# Patient Record
Sex: Male | Born: 1990 | Race: White | Hispanic: No | Marital: Single | Smoking: Never smoker
Health system: Southern US, Community
[De-identification: ages and names within clinical notes are randomized; demographics above are authoritative.]

## PROBLEM LIST (undated history)

## (undated) DIAGNOSIS — F32A Depression, unspecified: Secondary | ICD-10-CM

## (undated) DIAGNOSIS — F419 Anxiety disorder, unspecified: Secondary | ICD-10-CM

## (undated) DIAGNOSIS — F1910 Other psychoactive substance abuse, uncomplicated: Secondary | ICD-10-CM

## (undated) DIAGNOSIS — F319 Bipolar disorder, unspecified: Secondary | ICD-10-CM

## (undated) DIAGNOSIS — F840 Autistic disorder: Secondary | ICD-10-CM

## (undated) HISTORY — PX: NO PAST SURGERIES: SHX2092

## (undated) HISTORY — DX: Depression, unspecified: F32.A

## (undated) HISTORY — DX: Bipolar disorder, unspecified: F31.9

## (undated) HISTORY — DX: Autistic disorder: F84.0

## (undated) HISTORY — DX: Other psychoactive substance abuse, uncomplicated: F19.10

## (undated) HISTORY — DX: Anxiety disorder, unspecified: F41.9

---

## 2019-07-31 LAB — BASIC METABOLIC PANEL
BUN: 7 (ref 4–21)
CO2: 20 (ref 13–22)
Chloride: 108 (ref 99–108)
Creatinine: 0.8 (ref 0.6–1.3)
Glucose: 86
Potassium: 3.5 (ref 3.4–5.3)
Sodium: 140 (ref 137–147)

## 2019-07-31 LAB — CBC: RBC: 5.2 — AB (ref 3.87–5.11)

## 2019-07-31 LAB — CBC AND DIFFERENTIAL
HCT: 45 (ref 41–53)
Hemoglobin: 14.7 (ref 13.5–17.5)
Platelets: 290 (ref 150–399)
WBC: 9.2

## 2019-07-31 LAB — COMPREHENSIVE METABOLIC PANEL
Calcium: 9 (ref 8.7–10.7)
GFR calc non Af Amer: 122

## 2019-07-31 LAB — TESTOSTERONE: Testosterone: 280

## 2019-07-31 LAB — PSA: PSA: 0.4

## 2019-08-03 DIAGNOSIS — F840 Autistic disorder: Secondary | ICD-10-CM | POA: Insufficient documentation

## 2019-08-03 DIAGNOSIS — F411 Generalized anxiety disorder: Secondary | ICD-10-CM | POA: Insufficient documentation

## 2019-08-06 DIAGNOSIS — F1990 Other psychoactive substance use, unspecified, uncomplicated: Secondary | ICD-10-CM | POA: Insufficient documentation

## 2019-08-06 DIAGNOSIS — F1021 Alcohol dependence, in remission: Secondary | ICD-10-CM | POA: Insufficient documentation

## 2019-08-06 DIAGNOSIS — F429 Obsessive-compulsive disorder, unspecified: Secondary | ICD-10-CM | POA: Insufficient documentation

## 2019-08-06 DIAGNOSIS — F439 Reaction to severe stress, unspecified: Secondary | ICD-10-CM | POA: Insufficient documentation

## 2019-08-06 DIAGNOSIS — F909 Attention-deficit hyperactivity disorder, unspecified type: Secondary | ICD-10-CM | POA: Insufficient documentation

## 2019-08-30 LAB — HEPATIC FUNCTION PANEL
ALT: 133 — AB (ref 10–40)
AST: 72 — AB (ref 14–40)
Alkaline Phosphatase: 107 (ref 25–125)
Bilirubin, Total: 1

## 2020-01-31 ENCOUNTER — Ambulatory Visit
Admission: EM | Admit: 2020-01-31 | Discharge: 2020-01-31 | Disposition: A | Discharge status: Home / Self Care | Payer: Medicaid Other

## 2020-01-31 ENCOUNTER — Other Ambulatory Visit: Payer: Self-pay

## 2020-01-31 DIAGNOSIS — R002 Palpitations: Secondary | ICD-10-CM | POA: Diagnosis not present

## 2020-01-31 DIAGNOSIS — B349 Viral infection, unspecified: Secondary | ICD-10-CM | POA: Insufficient documentation

## 2020-01-31 NOTE — ED Provider Notes (Signed)
Jorge Hanna    CSN: [id number] Arrival date & time: 01/31/20  7741      History   Chief Complaint Chief Complaint  Patient presents with  . Fatigue  . Anorexia    HPI Jorge Hanna is a 29 y.o. male.   Patient presents with 2-week history of fatigue, weakness, chills.  He also reports 1 episode of feeling heart palpitations and tachycardia 2 days ago.  Patient states all of his symptoms come and go and he currently is asymptomatic.  He denies fever, rash, sore throat, cough, shortness of breath, vomiting, diarrhea, or other symptoms.  He denies pertinent medical history.  The history is provided by the patient and medical records.    History reviewed. No pertinent past medical history.  There are no problems to display for this patient.   History reviewed. No pertinent surgical history.     Home Medications    Prior to Admission medications   Medication Sig Start Date End Date Taking? Authorizing Provider  [facility] MELATONIN 3 MG TABS tablet  11/27/19   [provider]  DULoxetine (CYMBALTA) 60 MG capsule Take 60 mg by mouth daily. 01/21/20   [provider]  hydrOXYzine (VISTARIL) 50 MG capsule Take 50 mg by mouth 2 (two) times daily as needed. 01/25/20   [provider]  lamoTRIgine (LAMICTAL) 100 MG tablet Take 100 mg by mouth daily. 09/30/19   [provider]  lithium 300 MG tablet Take 450 mg by mouth in the morning and at bedtime. 01/21/20   [provider]  Oxcarbazepine (TRILEPTAL) 300 MG tablet Take 300 mg by mouth 2 (two) times daily. 01/09/20   [provider]  traZODone (DESYREL) 50 MG tablet  01/21/20   [provider]    Family History History reviewed. No pertinent family history.  Social History Social History   Tobacco Use  . Smoking status: Never Smoker  . Smokeless tobacco: Never Used  Vaping Use  . Vaping Use: Never used  Substance Use Topics  . Alcohol use: Not Currently  .  Drug use: Not Currently     Allergies   Patient has no known allergies.   Review of Systems Review of Systems  Constitutional: Positive for fatigue. Negative for chills and fever.  HENT: Negative for ear pain and sore throat.   Eyes: Negative for pain and visual disturbance.  Respiratory: Negative for cough and shortness of breath.   Cardiovascular: Positive for palpitations. Negative for chest pain.  Gastrointestinal: Negative for abdominal pain, diarrhea and vomiting.  Genitourinary: Negative for dysuria and hematuria.  Musculoskeletal: Negative for arthralgias and back pain.  Skin: Negative for color change and rash.  Neurological: Positive for weakness. Negative for seizures and syncope.       Generalized weakness; no focal weakness.   All other systems reviewed and are negative.    Physical Exam Triage Vital Signs ED Triage Vitals  Enc Vitals Group     BP      Pulse      Resp      Temp      Temp src      SpO2      Weight      Height      Head Circumference      Peak Flow      Pain Score      Pain Loc      Pain Edu?      Excl. in GC?    No  data found.  Updated Vital Signs BP (!) 141/84 (BP Location: Right Arm)   Pulse (!) 111   Temp 98.9 F (37.2 C) (Oral)   SpO2 98%   Visual Acuity Right Eye Distance:   Left Eye Distance:   Bilateral Distance:    Right Eye Near:   Left Eye Near:    Bilateral Near:     Physical Exam Vitals and nursing note reviewed.  Constitutional:      General: He is not in acute distress.    Appearance: He is well-developed. He is not ill-appearing.  HENT:     Head: Normocephalic and atraumatic.     Mouth/Throat:     Mouth: Mucous membranes are moist.  Eyes:     Conjunctiva/sclera: Conjunctivae normal.  Cardiovascular:     Rate and Rhythm: Regular rhythm. Tachycardia present.     Heart sounds: Normal heart sounds.  Pulmonary:     Effort: Pulmonary effort is normal. No respiratory distress.     Breath sounds: Normal  breath sounds.  Abdominal:     Palpations: Abdomen is soft.     Tenderness: There is no abdominal tenderness.  Musculoskeletal:     Cervical back: Neck supple.  Skin:    General: Skin is warm and dry.     Findings: No rash.  Neurological:     General: No focal deficit present.     Mental Status: He is alert and oriented to person, place, and time.     Sensory: No sensory deficit.     Motor: No weakness.     Gait: Gait normal.      UC Treatments / Results  Labs (all labs ordered are listed, but only abnormal results are displayed) Labs Reviewed  NOVEL CORONAVIRUS, NAA    EKG   Radiology No results found.  Procedures Procedures (including critical care time)  Medications Ordered in UC Medications - No data to display  Initial Impression / Assessment and Plan / UC Course  I have reviewed the triage vital signs and the nursing notes.  Pertinent labs & imaging results that were available during my care of the patient were reviewed by me and considered in my medical decision making (see chart for details).   Viral illness, palpitations.  EKG shows sinus rhythm, rate 88, no ST elevation, no previous to compare.  Instructed patient to go to the ED if he has acute chest pain, palpitations, or other concerning symptoms.  PCR COVID pending.  Instructed patient to self quarantine until the test result is back.  Discussed symptomatic treatment including Tylenol, rest, hydration.  Instructed patient to follow up with PCP if his symptoms are not improving  Patient agrees to plan of care.    Final Clinical Impressions(s) / UC Diagnoses   Final diagnoses:  Viral illness  Palpitations     Discharge Instructions     Your COVID test is pending.  You should self quarantine until the test result is back.    Take Tylenol as needed for fever or discomfort.  Rest and keep yourself hydrated.    Follow up with your primary care provider if your symptoms are not improving.          ED Prescriptions    None     PDMP not reviewed this encounter.   Mickie Bail, NP 01/31/20 1042

## 2020-01-31 NOTE — Discharge Instructions (Signed)
Your COVID test is pending.  You should self quarantine until the test result is back.    Take Tylenol as needed for fever or discomfort.  Rest and keep yourself hydrated.    Follow-up with your primary care provider if your symptoms are not improving.     

## 2020-01-31 NOTE — ED Notes (Signed)
Patient presents to Urgent Care with complaints of chills, weakness, loss of appetite on/off for 2 weeks, with one exp. an episode of heart palpitations. Mom shares instructed by his psy to get evaluated.

## 2020-01-31 NOTE — ED Triage Notes (Signed)
Pt reports being here for fatigue, chills, weakness on/off for two weeks. Reports having one episode of heart palpitation. Pt reports psy provider instructed pt to come here for eval.

## 2020-02-02 LAB — SARS-COV-2, NAA 2 DAY TAT

## 2020-02-02 LAB — NOVEL CORONAVIRUS, NAA: SARS-CoV-2, NAA: NOT DETECTED

## 2020-04-08 ENCOUNTER — Ambulatory Visit: Payer: Self-pay | Admitting: Family Medicine

## 2020-04-14 ENCOUNTER — Telehealth: Payer: Self-pay | Admitting: Family Medicine

## 2020-04-14 ENCOUNTER — Encounter: Payer: Self-pay | Admitting: Family Medicine

## 2020-04-14 ENCOUNTER — Telehealth (INDEPENDENT_AMBULATORY_CARE_PROVIDER_SITE_OTHER): Payer: Medicaid Other | Admitting: Family Medicine

## 2020-04-14 VITALS — Ht 69.0 in | Wt 200.0 lb

## 2020-04-14 DIAGNOSIS — F32A Depression, unspecified: Secondary | ICD-10-CM | POA: Insufficient documentation

## 2020-04-14 DIAGNOSIS — R5382 Chronic fatigue, unspecified: Secondary | ICD-10-CM

## 2020-04-14 DIAGNOSIS — D72829 Elevated white blood cell count, unspecified: Secondary | ICD-10-CM

## 2020-04-14 DIAGNOSIS — F840 Autistic disorder: Secondary | ICD-10-CM | POA: Diagnosis not present

## 2020-04-14 DIAGNOSIS — R7989 Other specified abnormal findings of blood chemistry: Secondary | ICD-10-CM

## 2020-04-14 DIAGNOSIS — F419 Anxiety disorder, unspecified: Secondary | ICD-10-CM

## 2020-04-14 DIAGNOSIS — F1911 Other psychoactive substance abuse, in remission: Secondary | ICD-10-CM | POA: Insufficient documentation

## 2020-04-14 DIAGNOSIS — Z79899 Other long term (current) drug therapy: Secondary | ICD-10-CM

## 2020-04-14 DIAGNOSIS — F1910 Other psychoactive substance abuse, uncomplicated: Secondary | ICD-10-CM | POA: Diagnosis not present

## 2020-04-14 DIAGNOSIS — F331 Major depressive disorder, recurrent, moderate: Secondary | ICD-10-CM

## 2020-04-14 DIAGNOSIS — F319 Bipolar disorder, unspecified: Secondary | ICD-10-CM

## 2020-04-14 NOTE — Assessment & Plan Note (Signed)
Followed by Psych - will send ROI as above No changes to medications at this time Seems stable today with no behavioral issues

## 2020-04-14 NOTE — Assessment & Plan Note (Signed)
Ongoing and intermittent for 3-4 months Nonspecific symptoms including malaise, fatigue, loss of appetite Given h/o substance abuse with elevated WBC and LFTs and use of lithium, will check CMP, CBC with diff, and TSH to start Further eval/treatment pending results.

## 2020-04-14 NOTE — Assessment & Plan Note (Signed)
Followed by psychiatry Lives alone, but mother is his legal guardian

## 2020-04-14 NOTE — Telephone Encounter (Signed)
Pts mom called and stated that the Pt would like to have his Labs done at a particular [facility] and their fax number to send order is [phone]/ please advise

## 2020-04-14 NOTE — Progress Notes (Signed)
New patient visit   Patient: Jorge Hanna   DOB: 30-Apr-1990   29 y.o. Male  MRN: [id number] Visit Date: 04/14/2020  Today's healthcare provider: Shirlee Latch, MD   Virtual Visit via Video Note  I connected with Jorge Hanna on 04/14/20 at  1:40 PM EST by a video enabled telemedicine application and verified that I am speaking with the correct person using two identifiers.  Location: Patient location: home Provider location: home office Persons involved in the visit: patient, provider   I discussed the limitations of evaluation and management by telemedicine and the availability of in person appointments. The patient expressed understanding and agreed to proceed.    Chief Complaint  Patient presents with  . New Patient (Initial Visit)   Subjective    Jorge Hanna is a 30 y.o. male who presents today as a new patient to establish care. Patient's legal guardian and adoptive mother Jorge Hanna answered questions. She reports patient has not had a PCP, reports he is Autistic, Bipolar and is currently being treated for depression and anxiety. She reports patient does have a history of abusing alcohol and controlled medications. She also reports patient has abused OTC medications in the past like Nyquil, and Tylenol. She is concerned about his liver.   Reports that he is not using any substances other than what is prescribed to him. He got off of benzos a few months ago.  HPI  Jorge Hanna is a patient at [facility] in [place] for psychiatric medication management.  Feels like medications are controlling symptoms well.  Reports problem for 3-4 months intermittently with weakness, myalgias. Once had a low grade fever, but none since. No associated cough or URI symptoms.  Reports cold intolerance, change in appetite.  This is better now.  Was told that he had elevated WBC by psych during this time.  Past Medical History:  Diagnosis Date  . Anxiety   .  Autistic disorder   . Bipolar 1 disorder (HCC)   . Depression   . Substance abuse [facility])    Past Surgical History:  Procedure Laterality Date  . NO PAST SURGERIES     No family status information on file.   Family History  Adopted: Yes   Social History   Socioeconomic History  . Marital status: Single    Spouse name: Not on file  . Number of children: 0  . Years of education: Not on file  . Highest education level: Not on file  Occupational History  . Occupation: disability  Tobacco Use  . Smoking status: Never Smoker  . Smokeless tobacco: Never Used  Vaping Use  . Vaping Use: Never used  Substance and Sexual Activity  . Alcohol use: Not Currently    Comment: h/o abuse  . Drug use: Not Currently    Types: Other-see comments    Comment: NYQUIL and benzos  . Sexual activity: Not on file  Other Topics Concern  . Not on file  Social History Narrative  . Not on file   Social Determinants of Health   Financial Resource Strain: Not on file  Food Insecurity: Not on file  Transportation Needs: Not on file  Physical Activity: Not on file  Stress: Not on file  Social Connections: Not on file   Outpatient Medications Prior to Visit  Medication Sig  . DULoxetine (CYMBALTA) 60 MG capsule Take 60 mg by mouth daily.  . hydrOXYzine (VISTARIL) 50 MG capsule Take 50 mg by mouth 2 (two)  times daily as needed.  . lamoTRIgine (LAMICTAL) 100 MG tablet Take 100 mg by mouth daily.  Marland Kitchen lithium 300 MG tablet Take 450 mg by mouth daily.  . Oxcarbazepine (TRILEPTAL) 300 MG tablet Take 300 mg by mouth 2 (two) times daily.  . traZODone (DESYREL) 50 MG tablet Take 50 mg by mouth at bedtime.  . [DISCONTINUED] [facility] MELATONIN 3 MG TABS tablet    No facility-administered medications prior to visit.   No Known Allergies  Immunization History  Administered Date(s) Administered  . Tdap 03/29/2019    Health Maintenance  Topic Date Due  . Hepatitis C Screening  Never done  . HIV Screening   Never done  . INFLUENZA VACCINE  06/19/2020 (Originally 10/21/2019)  . TETANUS/TDAP  03/28/2029    Patient Care Team: Pcp, No as PCP - General  Review of Systems  Constitutional: Positive for activity change, appetite change, chills and fatigue.  HENT: Negative.   Eyes: Negative.   Respiratory: Negative.   Cardiovascular: Negative.   Gastrointestinal: Negative.   Endocrine: Positive for cold intolerance.  Genitourinary: Negative.   Musculoskeletal: Negative.   Skin: Negative.   Neurological: Negative.   Hematological: Negative.   Psychiatric/Behavioral: Negative.       Objective    Ht 5\' 9"  (1.753 m)   Wt 200 lb (90.7 kg)   BMI 29.53 kg/m  Physical Exam Constitutional:      General: He is not in acute distress.    Appearance: Normal appearance. He is not diaphoretic.  HENT:     Head: Normocephalic.  Pulmonary:     Effort: Pulmonary effort is normal. No respiratory distress.  Neurological:     Mental Status: He is alert and oriented to person, place, and time.  Psychiatric:        Behavior: Behavior normal.     Depression Screen PHQ 2/9 Scores 04/14/2020  Exception Documentation Other- indicate reason in comment box   No results found for any visits on 04/14/20.  Assessment & Plan     Follow Up Instructions: I discussed the assessment and treatment plan with the patient. The patient was provided an opportunity to ask questions and all were answered. The patient agreed with the plan and demonstrated an understanding of the instructions.   The patient was advised to call back or seek an in-person evaluation if the symptoms worsen or if the condition fails to improve as anticipated.  I provided 35 minutes of non-face-to-face time during this encounter.  Problem List Items Addressed This Visit      Other   Autistic disorder    Followed by psychiatry Lives alone, but mother is his legal guardian      Bipolar 1 disorder (HCC)    Followed by Psych - will  send ROI as above No changes to medications at this time Seems stable today with no behavioral issues      Relevant Orders   TSH   CBC w/Diff/Platelet   Comprehensive metabolic panel   Substance abuse (HCC)    History of polysubstance abuse, including alcohol, nyquil, tylenol, benzos Claims to not be using anything other than prescriptions per psych Will avoid controlled substances      Depression    Followed by psych Stable at this time Denies SI No changes to medications Avoid controlled substances due to history      Anxiety    Followed by psych No changes to medications Stable at this time Avoid controlled substances given h/o abuse ROI to  send to Psych      Chronic fatigue - Primary    Ongoing and intermittent for 3-4 months Nonspecific symptoms including malaise, fatigue, loss of appetite Given h/o substance abuse with elevated WBC and LFTs and use of lithium, will check CMP, CBC with diff, and TSH to start Further eval/treatment pending results.      Relevant Orders   TSH   CBC w/Diff/Platelet   Comprehensive metabolic panel    Other Visit Diagnoses    Leukocytosis, unspecified type       Relevant Orders   CBC w/Diff/Platelet   Elevated LFTs       Relevant Orders   Comprehensive metabolic panel   Long-term use of high-risk medication       Relevant Orders   TSH   CBC w/Diff/Platelet   Comprehensive metabolic panel       Return in about 3 months (around 07/13/2020) for CPE.     I, Shirlee Latch, MD, have reviewed all documentation for this visit. The documentation on 04/14/20 for the exam, diagnosis, procedures, and orders are all accurate and complete.   Alston Berrie, Marzella Schlein, MD, MPH [facility]

## 2020-04-14 NOTE — Assessment & Plan Note (Signed)
Followed by psych Stable at this time Denies SI No changes to medications Avoid controlled substances due to history

## 2020-04-14 NOTE — Assessment & Plan Note (Signed)
Followed by psych No changes to medications Stable at this time Avoid controlled substances given h/o abuse ROI to send to Psych

## 2020-04-14 NOTE — Assessment & Plan Note (Signed)
History of polysubstance abuse, including alcohol, nyquil, tylenol, benzos Claims to not be using anything other than prescriptions per psych Will avoid controlled substances

## 2020-04-15 NOTE — Telephone Encounter (Signed)
Lab order faxed and patient advised.

## 2020-04-16 LAB — COMPREHENSIVE METABOLIC PANEL
ALT: 119 IU/L — ABNORMAL HIGH (ref 0–44)
AST: 82 IU/L — ABNORMAL HIGH (ref 0–40)
Albumin/Globulin Ratio: 2 (ref 1.2–2.2)
Albumin: 5.1 g/dL (ref 4.1–5.2)
Alkaline Phosphatase: 113 IU/L (ref 44–121)
BUN/Creatinine Ratio: 8 — ABNORMAL LOW (ref 9–20)
BUN: 8 mg/dL (ref 6–20)
Bilirubin Total: 0.4 mg/dL (ref 0.0–1.2)
CO2: 22 mmol/L (ref 20–29)
Calcium: 10 mg/dL (ref 8.7–10.2)
Chloride: 101 mmol/L (ref 96–106)
Creatinine, Ser: 0.96 mg/dL (ref 0.76–1.27)
GFR calc Af Amer: 123 mL/min/{1.73_m2} (ref >59)
GFR calc non Af Amer: 106 mL/min/{1.73_m2} (ref >59)
Globulin, Total: 2.6 g/dL (ref 1.5–4.5)
Glucose: 110 mg/dL — ABNORMAL HIGH (ref 65–99)
Potassium: 4.7 mmol/L (ref 3.5–5.2)
Sodium: 136 mmol/L (ref 134–144)
Total Protein: 7.7 g/dL (ref 6.0–8.5)

## 2020-04-16 LAB — CBC WITH DIFFERENTIAL/PLATELET
Basophils Absolute: 0 10*3/uL (ref 0.0–0.2)
Basos: 1 %
EOS (ABSOLUTE): 0.2 10*3/uL (ref 0.0–0.4)
Eos: 2 %
Hematocrit: 40.5 % (ref 37.5–51.0)
Hemoglobin: 12.7 g/dL — ABNORMAL LOW (ref 13.0–17.7)
Immature Grans (Abs): 0 10*3/uL (ref 0.0–0.1)
Immature Granulocytes: 0 %
Lymphocytes Absolute: 2.5 10*3/uL (ref 0.7–3.1)
Lymphs: 28 %
MCH: 22.9 pg — ABNORMAL LOW (ref 26.6–33.0)
MCHC: 31.4 g/dL — ABNORMAL LOW (ref 31.5–35.7)
MCV: 73 fL — ABNORMAL LOW (ref 79–97)
Monocytes Absolute: 0.3 10*3/uL (ref 0.1–0.9)
Monocytes: 4 %
Neutrophils Absolute: 5.7 10*3/uL (ref 1.4–7.0)
Neutrophils: 65 %
Platelets: 363 10*3/uL (ref 150–450)
RBC: 5.54 x10E6/uL (ref 4.14–5.80)
RDW: 15.8 % — ABNORMAL HIGH (ref 11.6–15.4)
WBC: 8.7 10*3/uL (ref 3.4–10.8)

## 2020-04-16 LAB — TSH: TSH: 1.67 u[IU]/mL (ref 0.450–4.500)

## 2020-04-18 ENCOUNTER — Encounter: Payer: Self-pay | Admitting: Family Medicine

## 2020-04-18 NOTE — Telephone Encounter (Signed)
This encounter was created in error - please disregard.

## 2020-04-21 ENCOUNTER — Telehealth: Payer: Self-pay

## 2020-04-21 NOTE — Telephone Encounter (Signed)
-----   Message from Erasmo Downer, MD sent at 04/17/2020 10:48 AM EST ----- Normal thyroid function, kidney function, electrolytes.  Blood sugar slightly elevated.  If patient was fasting when these labs were drawn, recommend adding on A1c for confirmation.  If he was not fasting, then this is a normal blood sugar.  Liver function tests remain elevated, but stable.  Would recommend avoiding all Tylenol, alcohol, benzos and hydrating well.  We can recheck LFTs in 1 month to see if they are improving.  Patient is slightly anemic.  This appears to be new since last labs were drawn.  This appears to be related to iron deficiency given the low MCV.  Please add on iron panel.  Is patient experiencing bleeding anywhere?  If not, may have nutritional iron deficit.  Increase iron rich foods and pending iron panel we May start iron supplement.  We will also recheck CBC in 1 month

## 2020-04-24 ENCOUNTER — Telehealth: Payer: Self-pay

## 2020-04-24 DIAGNOSIS — D509 Iron deficiency anemia, unspecified: Secondary | ICD-10-CM

## 2020-04-24 DIAGNOSIS — R7989 Other specified abnormal findings of blood chemistry: Secondary | ICD-10-CM

## 2020-04-24 DIAGNOSIS — R739 Hyperglycemia, unspecified: Secondary | ICD-10-CM

## 2020-04-24 NOTE — Telephone Encounter (Signed)
Copied from CRM [id number]. Topic: General - Other >> Apr 24, 2020  9:03 AM Darron Doom wrote: Reason for CRM: Patient mom Santina Evans called in to inform Dr B that patient need some more labs an iron panel and another she could not remember but asking for orders to be faxed to [facility] please. Any questions call Santina Evans at Ph# [phone] Fax# [phone]

## 2020-04-24 NOTE — Telephone Encounter (Signed)
Copied from CRM [id number]. Topic: General - Other >> Apr 24, 2020 11:35 AM Gaetana Michaelis A wrote: Patient's mother has made contact to request that lab orders (please see closed TE from 04/24/20) be faxed to [phone] Patient and father are at the office and told that no orders have been faxed

## 2020-04-25 NOTE — Telephone Encounter (Signed)
Per Dr. Beryle Flock, patient should just wait to check A1c in one month with LFT and Iron panel. Patient's mother advised.

## 2020-04-25 NOTE — Telephone Encounter (Signed)
Labs have been order. We will need to fax when patient is ready to get labs done.

## 2020-04-25 NOTE — Addendum Note (Signed)
Addended by: Hyacinth Meeker on: 04/25/2020 01:57 PM   Modules accepted: Orders

## 2020-05-15 ENCOUNTER — Telehealth: Payer: Self-pay | Admitting: Family Medicine

## 2020-05-15 NOTE — Telephone Encounter (Signed)
Patient's mother called to ask the doctor to fax an  order to [facility] for his blood work to check his A1C and iron levels.  Please advise and call to discuss further at [phone]

## 2020-05-16 NOTE — Telephone Encounter (Signed)
Best contact: [phone] Mother wants to be contacted once this is completed, please advise. Awaiting call back, I asked her if she contacted [facility] and she says that she is unable to get through to them

## 2020-05-16 NOTE — Telephone Encounter (Signed)
Faxed to [facility] [phone]. Parent advised.

## 2020-06-05 ENCOUNTER — Telehealth: Payer: Self-pay

## 2020-06-05 ENCOUNTER — Other Ambulatory Visit: Payer: Self-pay

## 2020-06-05 DIAGNOSIS — Z79899 Other long term (current) drug therapy: Secondary | ICD-10-CM

## 2020-06-05 NOTE — Addendum Note (Signed)
Addended by: Hyacinth Meeker on: 06/05/2020 01:54 PM   Modules accepted: Orders

## 2020-06-05 NOTE — Telephone Encounter (Signed)
Copied from CRM [id number]. Topic: General - Other >> Jun 04, 2020  3:34 PM Laural Benes, [place] C wrote: Reason for CRM: pt's mom is calling in to request to have a Vitamin C level order added to pt's labs. She says that pt has had low vitamin levels in the past. Mom says that she will be taking pt to have labs completed once she is feeling better from covid.

## 2020-06-05 NOTE — Telephone Encounter (Signed)
Ok to order 

## 2020-06-09 ENCOUNTER — Encounter: Payer: Self-pay | Admitting: Family Medicine

## 2020-06-10 ENCOUNTER — Telehealth: Payer: Self-pay

## 2020-06-10 NOTE — Telephone Encounter (Signed)
Copied from CRM [id number]. Topic: General - Other >> Jun 10, 2020  1:16 PM Leafy Ro wrote: Reason for CRM: Pt mom sent mychart message yesterday and would like to know if dr b would order vit b12 level for dizziness and weakness. Pt would like to have lab drawn today

## 2020-06-11 LAB — HEPATIC FUNCTION PANEL
ALT: 132 IU/L — ABNORMAL HIGH (ref 0–44)
AST: 99 IU/L — ABNORMAL HIGH (ref 0–40)
Albumin: 4.6 g/dL (ref 4.1–5.2)
Alkaline Phosphatase: 122 IU/L — ABNORMAL HIGH (ref 44–121)
Bilirubin Total: 0.4 mg/dL (ref 0.0–1.2)
Bilirubin, Direct: 0.27 mg/dL (ref 0.00–0.40)
Total Protein: 7.3 g/dL (ref 6.0–8.5)

## 2020-06-11 LAB — IRON,TIBC AND FERRITIN PANEL
Ferritin: 56 ng/mL (ref 30–400)
Iron Saturation: 20 % (ref 15–55)
Iron: 74 ug/dL (ref 38–169)
Total Iron Binding Capacity: 365 ug/dL (ref 250–450)
UIBC: 291 ug/dL (ref 111–343)

## 2020-06-11 LAB — HEMOGLOBIN A1C
Est. average glucose Bld gHb Est-mCnc: 105 mg/dL
Hgb A1c MFr Bld: 5.3 % (ref 4.8–5.6)

## 2020-06-11 LAB — VITAMIN D 25 HYDROXY (VIT D DEFICIENCY, FRACTURES): Vit D, 25-Hydroxy: 5.5 ng/mL — ABNORMAL LOW (ref 30.0–100.0)

## 2020-06-12 ENCOUNTER — Telehealth: Payer: Self-pay

## 2020-06-12 DIAGNOSIS — R7989 Other specified abnormal findings of blood chemistry: Secondary | ICD-10-CM

## 2020-06-12 NOTE — Telephone Encounter (Signed)
-----   Message from Erasmo Downer, MD sent at 06/12/2020  2:35 PM EDT ----- Normal A1c and iron panel.  Liver function tests remain elevated. Recommend GI referral and RUQ [place]. CMAs please order if ok with patient

## 2020-06-12 NOTE — Telephone Encounter (Signed)
Ok to order. [facility] will be able to pull it up, so no need to fax

## 2020-06-13 LAB — VITAMIN C: Vitamin C: 0.5 mg/dL (ref 0.4–2.0)

## 2020-06-13 NOTE — Telephone Encounter (Signed)
Called [facility] to add Vitamin B12.

## 2020-06-16 ENCOUNTER — Encounter: Payer: Self-pay | Admitting: *Deleted

## 2020-06-20 LAB — VITAMIN B12: Vitamin B-12: 601 pg/mL (ref 232–1245)

## 2020-06-20 LAB — SPECIMEN STATUS REPORT

## 2020-07-14 ENCOUNTER — Ambulatory Visit: Admission: RE | Admit: 2020-07-14 | Payer: Medicaid Other | Source: Ambulatory Visit

## 2020-08-12 ENCOUNTER — Ambulatory Visit
Admission: RE | Admit: 2020-08-12 | Discharge: 2020-08-12 | Disposition: A | Discharge status: Home / Self Care | Payer: Medicaid Other | Source: Ambulatory Visit | Attending: Family Medicine | Admitting: Family Medicine

## 2020-08-12 ENCOUNTER — Other Ambulatory Visit: Payer: Self-pay

## 2020-08-12 DIAGNOSIS — R7989 Other specified abnormal findings of blood chemistry: Secondary | ICD-10-CM | POA: Insufficient documentation

## 2021-10-09 ENCOUNTER — Other Ambulatory Visit: Payer: Self-pay

## 2021-10-09 ENCOUNTER — Emergency Department
Admission: EM | Admit: 2021-10-09 | Discharge: 2021-10-10 | Disposition: A | Discharge status: Home / Self Care | Payer: Medicaid Other | Attending: Emergency Medicine | Admitting: Emergency Medicine

## 2021-10-09 ENCOUNTER — Emergency Department: Payer: Medicaid Other

## 2021-10-09 DIAGNOSIS — T1491XA Suicide attempt, initial encounter: Secondary | ICD-10-CM | POA: Diagnosis not present

## 2021-10-09 DIAGNOSIS — F840 Autistic disorder: Secondary | ICD-10-CM | POA: Diagnosis not present

## 2021-10-09 DIAGNOSIS — Z20822 Contact with and (suspected) exposure to covid-19: Secondary | ICD-10-CM | POA: Insufficient documentation

## 2021-10-09 DIAGNOSIS — F319 Bipolar disorder, unspecified: Secondary | ICD-10-CM | POA: Diagnosis present

## 2021-10-09 DIAGNOSIS — T43212A Poisoning by selective serotonin and norepinephrine reuptake inhibitors, intentional self-harm, initial encounter: Secondary | ICD-10-CM | POA: Diagnosis present

## 2021-10-09 DIAGNOSIS — F1911 Other psychoactive substance abuse, in remission: Secondary | ICD-10-CM | POA: Diagnosis present

## 2021-10-09 DIAGNOSIS — F419 Anxiety disorder, unspecified: Secondary | ICD-10-CM | POA: Diagnosis present

## 2021-10-09 DIAGNOSIS — F32A Depression, unspecified: Secondary | ICD-10-CM | POA: Diagnosis present

## 2021-10-09 DIAGNOSIS — F1910 Other psychoactive substance abuse, uncomplicated: Secondary | ICD-10-CM | POA: Diagnosis present

## 2021-10-09 LAB — COMPREHENSIVE METABOLIC PANEL
ALT: 106 U/L — ABNORMAL HIGH (ref 0–44)
ALT: 91 U/L — ABNORMAL HIGH (ref 0–44)
AST: 57 U/L — ABNORMAL HIGH (ref 15–41)
AST: 50 U/L — ABNORMAL HIGH (ref 15–41)
Albumin: 4.6 g/dL (ref 3.5–5.0)
Albumin: 4 g/dL (ref 3.5–5.0)
Alkaline Phosphatase: 104 U/L (ref 38–126)
Alkaline Phosphatase: 85 U/L (ref 38–126)
Anion gap: 8 (ref 5–15)
Anion gap: 6 (ref 5–15)
BUN: 11 mg/dL (ref 6–20)
BUN: 11 mg/dL (ref 6–20)
CO2: 24 mmol/L (ref 22–32)
CO2: 23 mmol/L (ref 22–32)
Calcium: 9.4 mg/dL (ref 8.9–10.3)
Calcium: 8.5 mg/dL — ABNORMAL LOW (ref 8.9–10.3)
Chloride: 108 mmol/L (ref 98–111)
Chloride: 111 mmol/L (ref 98–111)
Creatinine, Ser: 1.03 mg/dL (ref 0.61–1.24)
Creatinine, Ser: 1.07 mg/dL (ref 0.61–1.24)
GFR, Estimated: >60 mL/min (ref >60)
GFR, Estimated: >60 mL/min (ref >60)
Glucose, Bld: 117 mg/dL — ABNORMAL HIGH (ref 70–99)
Glucose, Bld: 139 mg/dL — ABNORMAL HIGH (ref 70–99)
Potassium: 3.4 mmol/L — ABNORMAL LOW (ref 3.5–5.1)
Potassium: 3.9 mmol/L (ref 3.5–5.1)
Sodium: 140 mmol/L (ref 135–145)
Sodium: 140 mmol/L (ref 135–145)
Total Bilirubin: 0.9 mg/dL (ref 0.3–1.2)
Total Bilirubin: 0.7 mg/dL (ref 0.3–1.2)
Total Protein: 8.1 g/dL (ref 6.5–8.1)
Total Protein: 7.1 g/dL (ref 6.5–8.1)

## 2021-10-09 LAB — URINE DRUG SCREEN, QUALITATIVE (ARMC ONLY)
Amphetamines, Ur Screen: NOT DETECTED
Barbiturates, Ur Screen: NOT DETECTED
Benzodiazepine, Ur Scrn: NOT DETECTED
Cannabinoid 50 Ng, Ur ~~LOC~~: NOT DETECTED
Cocaine Metabolite,Ur ~~LOC~~: NOT DETECTED
MDMA (Ecstasy)Ur Screen: NOT DETECTED
Methadone Scn, Ur: NOT DETECTED
Opiate, Ur Screen: NOT DETECTED
Phencyclidine (PCP) Ur S: NOT DETECTED
Tricyclic, Ur Screen: NOT DETECTED

## 2021-10-09 LAB — SALICYLATE LEVEL
Salicylate Lvl: <7 mg/dL — ABNORMAL LOW (ref 7.0–30.0)
Salicylate Lvl: <7 mg/dL — ABNORMAL LOW (ref 7.0–30.0)

## 2021-10-09 LAB — BLOOD GAS, VENOUS
Acid-base deficit: 2 mmol/L (ref 0.0–2.0)
Bicarbonate: 22.3 mmol/L (ref 20.0–28.0)
O2 Saturation: 90.7 %
Patient temperature: 37
pCO2, Ven: 36 mmHg — ABNORMAL LOW (ref 44–60)
pH, Ven: 7.4 (ref 7.25–7.43)
pO2, Ven: 61 mmHg — ABNORMAL HIGH (ref 32–45)

## 2021-10-09 LAB — ACETAMINOPHEN LEVEL
Acetaminophen (Tylenol), Serum: <10 ug/mL — ABNORMAL LOW (ref 10–30)
Acetaminophen (Tylenol), Serum: <10 ug/mL — ABNORMAL LOW (ref 10–30)

## 2021-10-09 LAB — CBC
HCT: 47.1 % (ref 39.0–52.0)
Hemoglobin: 15.2 g/dL (ref 13.0–17.0)
MCH: 25.9 pg — ABNORMAL LOW (ref 26.0–34.0)
MCHC: 32.3 g/dL (ref 30.0–36.0)
MCV: 80.4 fL (ref 80.0–100.0)
Platelets: 382 10*3/uL (ref 150–400)
RBC: 5.86 MIL/uL — ABNORMAL HIGH (ref 4.22–5.81)
RDW: 13.7 % (ref 11.5–15.5)
WBC: 12.2 10*3/uL — ABNORMAL HIGH (ref 4.0–10.5)
nRBC: 0 % (ref 0.0–0.2)

## 2021-10-09 LAB — ETHANOL: Alcohol, Ethyl (B): <10 mg/dL (ref <10)

## 2021-10-09 MED ORDER — SODIUM CHLORIDE 0.9 % IV BOLUS
1000.0000 mL | Freq: Once | INTRAVENOUS | Status: AC
  Administered 2021-10-09: 1000 mL via INTRAVENOUS

## 2021-10-09 MED ORDER — MELATONIN 5 MG PO TABS
5.0000 mg | ORAL_TABLET | Freq: Every day | ORAL | Status: DC
  Administered 2021-10-09: 5 mg via ORAL
  Filled 2021-10-09: qty 1

## 2021-10-09 NOTE — ED Triage Notes (Signed)
Patient states he has been feeling suicidal today. States he has been drinking BANG energy drinks with 30 plus Cymbalta and other unnamed med. Patient states he was feeling suicidal and planned to his life by this. Patient tachy in triage.

## 2021-10-09 NOTE — ED Notes (Signed)
Pt mother who is legal guardian is at bedside

## 2021-10-09 NOTE — ED Notes (Addendum)
Pt to ED for Suicide attempt that occurred this AM. Pt states he took 30+ total of his medications of oxymethocarbonate and duloxitine- around 12 pills, and 5 trazodone around 9:10 this AM. Pt states he has had multiple suicide attempts including cutting to his artery, drinking excessive amount of alcohol, taking multiple pills.  Pt states he is suicidal due to "evil people in this world", increased stressed.   Pt denies CP, SOB, Abdominal pain.  Had episode of nausea, denies vomiting.   Pt is A&OX4 calm and cooperative upon assessment.

## 2021-10-09 NOTE — ED Notes (Addendum)
This Clinical research associate spoke with poison control and updated on pt labs and EKG findings, poison control recommendation is to monitor until tachycardia resolves.

## 2021-10-09 NOTE — ED Notes (Signed)
[facility] pending consult   

## 2021-10-09 NOTE — ED Provider Notes (Signed)
[facility] Provider Note   Event Date/Time   First MD Initiated Contact with Patient 10/09/21 [phone]     (approximate)  History   Suicide Attempt and Suicidal  HPI  Jorge Hanna is a 31 y.o. male   who on review of primary care note from November 2021 has a history of fatigue  Today at around 9 AM he felt disenfranchised with life and decided to take extra medication he took approximately a dozen Cymbalta tablets and 6 trazodone tablets in a suicide attempt.  At thereafter he reports he felt very strange like he was seeing triple vision but that is since gone away and he was staggering and stumbling and felt weak.  Denies any other overdose or ingestion.  He has had a previous suicide attempt per his mother        Physical Exam   Triage Vital Signs: ED Triage Vitals  Enc Vitals Group     BP 10/09/21 1557 130/82     Pulse Rate 10/09/21 1557 (!) 151     Resp 10/09/21 1557 (!) 22     Temp 10/09/21 1557 99.1 F (37.3 C)     Temp Source 10/09/21 1557 Oral     SpO2 10/09/21 1557 97 %     Weight 10/09/21 1558 215 lb (97.5 kg)     Height 10/09/21 1558 5\' 10"  (1.778 m)     Head Circumference --      Peak Flow --      Pain Score 10/09/21 1558 0     Pain Loc --      Pain Edu? --      Excl. in GC? --     Most recent vital signs: Vitals:   10/09/21 2135 10/09/21 2200  BP:  137/73  Pulse:  (!) 124  Resp:  (!) 24  Temp: 98.8 F (37.1 C)   SpO2:  100%     General: Awake, no distress.  Pleasant, noted to be tachycardic on monitor. CV:  Good peripheral perfusion.  Tachycardia.  Warm well perfused.  No murmurs or rub Resp:  Normal effort.  Clear bilaterally no cough no respiratory distress speaks in full clear sentences Abd:  No distention.  Soft nontender nondistended Other:  Equal facial expressions.  Has mild lateral gaze slow to extinguish nystagmus.  No noted ataxia in the extremities.  No tremors.  Mental status is normal well oriented at this  time.  He does express positive suicidal ideation earlier but is not revises he no longer feels suicidal at this time.   ED Results / Procedures / Treatments   Labs (all labs ordered are listed, but only abnormal results are displayed) Labs Reviewed  COMPREHENSIVE METABOLIC PANEL - Abnormal; Notable for the following components:      Result Value   Potassium 3.4 (*)    Glucose, Bld 117 (*)    AST 57 (*)    ALT 106 (*)    All other components within normal limits  CBC - Abnormal; Notable for the following components:   WBC 12.2 (*)    RBC 5.86 (*)    MCH 25.9 (*)    All other components within normal limits  ACETAMINOPHEN LEVEL - Abnormal; Notable for the following components:   Acetaminophen (Tylenol), Serum <10 (*)    All other components within normal limits  SALICYLATE LEVEL - Abnormal; Notable for the following components:   Salicylate Lvl <7.0 (*)    All other components within  normal limits  BLOOD GAS, VENOUS - Abnormal; Notable for the following components:   pCO2, Ven 36 (*)    pO2, Ven 61 (*)    All other components within normal limits  ACETAMINOPHEN LEVEL - Abnormal; Notable for the following components:   Acetaminophen (Tylenol), Serum <10 (*)    All other components within normal limits  SALICYLATE LEVEL - Abnormal; Notable for the following components:   Salicylate Lvl <7.0 (*)    All other components within normal limits  COMPREHENSIVE METABOLIC PANEL - Abnormal; Notable for the following components:   Glucose, Bld 139 (*)    Calcium 8.5 (*)    AST 50 (*)    ALT 91 (*)    All other components within normal limits  URINE DRUG SCREEN, QUALITATIVE ([facility] ONLY)  ETHANOL     EKG  Interpreted by me at 1600 heart rate 150 QRS 80 QTc 400 Sinus tachycardia, rate related changes.  No obvious acute ischemia though T wave inversions are present inferolateral distribution.  Of note patient denies any associated chest  pain.   RADIOLOGY     PROCEDURES:  Critical Care performed: Yes, see critical care procedure note(s)  CRITICAL CARE Performed by: Sharyn Creamer   Total critical care time: 35 minutes  Critical care time was exclusive of separately billable procedures and treating other patients.  Critical care was necessary to treat or prevent imminent or life-threatening deterioration.  Critical care was time spent personally by me on the following activities: development of treatment plan with patient and/or surrogate as well as nursing, discussions with consultants, evaluation of patient's response to treatment, examination of patient, obtaining history from patient or surrogate, ordering and performing treatments and interventions, ordering and review of laboratory studies, ordering and review of radiographic studies, pulse oximetry and re-evaluation of patient's condition.   Procedures   MEDICATIONS ORDERED IN ED: Medications  melatonin tablet 5 mg (5 mg Oral Given 10/09/21 2241)  sodium chloride 0.9 % bolus 1,000 mL (0 mLs Intravenous Stopped 10/09/21 2246)  sodium chloride 0.9 % bolus 1,000 mL (0 mLs Intravenous Stopped 10/09/21 2109)     IMPRESSION / MDM / ASSESSMENT AND PLAN / ED COURSE  I reviewed the triage vital signs and the nursing notes.                              Differential diagnosis includes, but is not limited to, acute ingestion overdose, suicidal ideation, toxic or metabolic etiology etc.  Clinical history and concern I have placed him under IVC and requested psychiatry consult, but of note he presents with significant and fairly extreme tachycardia sinus tachycardia after ingestion of suspected Cymbalta and trazodone  He reports his symptoms are improving, and had notable vision changes and weakness earlier that seems to be improving.  Now has only mild nystagmus.  Patient's presentation is most consistent with acute presentation with potential threat to life or bodily  function.  The patient is on the cardiac monitor to evaluate for evidence of arrhythmia and/or significant heart rate changes.  Clinical Course as of 10/09/21 2339  Fri Oct 09, 2021  2040 Heart rate downtrending.  Continue to observe.  We will give additional fluid bolus [MQ]    Clinical Course User Index [MQ] Sharyn Creamer, MD   Clinical Course as of 10/09/21 2339  Fri Oct 09, 2021  2040 Heart rate downtrending.  Continue to observe.  We will give additional fluid  bolus [MQ]    Clinical Course User Index [MQ] Sharyn Creamer, MD   CBC with mild leukocytosis, no acute infectious symptomatology.  Denies infectious symptoms.  Initial salicylate and Tylenol levels normal.  Negative drug screen  ----------------------------------------- 11:38 PM on 10/09/2021 ----------------------------------------- Ongoing care and disposition assigned Dr. Scotty Court, anticipate observation for medical clearance until approximately 5 AM as advised by poison control for 12-hour ops.  Thus far patient improving, heart rate improving, mental status normal.  Remains under IVC.  FINAL CLINICAL IMPRESSION(S) / ED DIAGNOSES   Final diagnoses:  Suicidal behavior with attempted self-injury [facility])     Rx / DC Orders   ED Discharge Orders     None        Note:  This document was prepared using Dragon voice recognition software and may include unintentional dictation errors.   Sharyn Creamer, MD 10/09/21 [phone]

## 2021-10-09 NOTE — ED Notes (Signed)
Pt dressed out into hospital provided attire by this RN and EDT caitlyn. Pt belongings include:  iphone 3 rings Watch 2 lip piercing's  Black shirt Black socks  Black and white slides Checkered blue underwear  Black with red stripe shorts   Pt bible kept at bedside with pt

## 2021-10-09 NOTE — ED Notes (Addendum)
This Clinical research associate spoke with Poison control. Poison control recommendations: EKG Continuous Cardiac Monitoring  In 4 hrs check: tylenol level, Salicylate Level, CMP IVF Monitor for electrolyte changes- hyponatremia Monitor for QTC prolongation   Minimum 12 hr observation and asymptomatic

## 2021-10-09 NOTE — ED Provider Notes (Signed)
Again seen on labs a very mild transaminitis.  No significant change from previous, salicylate and acetaminophen levels remain negative.  Vital signs improving.  Heart rate has normalized  Vitals:   10/09/21 2111 10/09/21 2135  BP: 135/83   Pulse: 95   Resp: (!) 24   Temp:  98.8 F (37.1 C)  SpO2: 97%       Sharyn Creamer, MD 10/09/21 2154

## 2021-10-10 DIAGNOSIS — F331 Major depressive disorder, recurrent, moderate: Secondary | ICD-10-CM

## 2021-10-10 DIAGNOSIS — F419 Anxiety disorder, unspecified: Secondary | ICD-10-CM | POA: Diagnosis not present

## 2021-10-10 DIAGNOSIS — F319 Bipolar disorder, unspecified: Secondary | ICD-10-CM | POA: Diagnosis not present

## 2021-10-10 DIAGNOSIS — F1910 Other psychoactive substance abuse, uncomplicated: Secondary | ICD-10-CM

## 2021-10-10 DIAGNOSIS — T1491XA Suicide attempt, initial encounter: Secondary | ICD-10-CM | POA: Insufficient documentation

## 2021-10-10 DIAGNOSIS — F840 Autistic disorder: Secondary | ICD-10-CM

## 2021-10-10 LAB — RESP PANEL BY RT-PCR (FLU A&B, COVID) ARPGX2
Influenza A by PCR: NEGATIVE
Influenza B by PCR: NEGATIVE
SARS Coronavirus 2 by RT PCR: NEGATIVE

## 2021-10-10 NOTE — ED Provider Notes (Signed)
Procedures  Clinical Course as of 10/10/21 0523  Fri Oct 09, 2021  2040 Heart rate downtrending.  Continue to observe.  We will give additional fluid bolus [MQ]    Clinical Course User Index [MQ] Sharyn Creamer, MD    ----------------------------------------- 5:23 AM on 10/10/2021 ----------------------------------------- Vital signs normalized.  No adverse events. Pt is medically clear to proceed with psychiatry evaluation and management.  Psych NP advises that psych admission is recommended.     Sharman Cheek, MD 10/10/21 [phone]

## 2021-10-10 NOTE — Consult Note (Signed)
[facility] Face-to-Face Psychiatry Consult   Reason for Consult:  Psychiatric Evaluation Referring Physician:  DR. Jacqualine Code Patient Identification: Jorge Hanna MRN:  [id number] Principal Diagnosis: <principal problem not specified> Diagnosis:  Active Problems:   Autistic disorder   Bipolar 1 disorder ([place])   Substance abuse ([place])   Depression   Anxiety   Total Time spent with patient: 45 minutes  Subjective:   "The energy drinks made me feel suicidal"  HPI:  Jorge Hanna, 31 y.o., male patient by TTS and this provider; chart reviewed and consulted with Dr. Jacqualine Code on 10/09/2021  On evaluation [name] reports that's he no longer suicidal.  He says when he drinks the drink "bang" the excessive caffeine makes him feel suicidal.  Per triage note, patient states he has been feeling suicidal today. States he has been drinking BANG energy drinks with 30 plus Cymbalta and other unnamed med. Patient states he was feeling suicidal and planned to his life by this. Patient tachy in triage.    Per TTS, During assessment patient appears alert and oriented x4, calm and cooperative. 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 Patient reports that this is not his first attempt "I attempted in December, I took 31 of my anti-depressants., I took 18 of my anti-depressants." Patient reports currently having a outpatient psychiatrist and has been diagnosed with Bipolar and Depression. Patient also reports being  hospitalized as a child and in 2021. Patient denies HI/AH/VH.  During evaluation Hearold Tough is sitting on the bed; he is alert/oriented x 4; calm/cooperative; and mood congruent with affect.  Patient is speaking in a clear tone at moderate volume, and normal pace; with fair eye contact.  His thought process is coherent and relevant; There is no indication that he is currently responding to internal/external stimuli or experiencing delusional thought content.  Patient denies suicidal/self-harm/homicidal ideation, psychosis, and paranoia.  Patient has remained calm throughout assessment and has answered questions appropriately.   Past Psychiatric History: Psychiatric Inpatient  hospiatlization when medically cleared  Risk to Self:   Risk to Others:   Prior Inpatient Therapy:   Prior Outpatient Therapy:    Past Medical History:  Past Medical History:  Diagnosis Date   Anxiety    Autistic disorder    Bipolar 1 disorder ([place])    Depression    Substance abuse ([place])     Past Surgical History:  Procedure Laterality Date   NO PAST SURGERIES     Family History:  Family History  Adopted: Yes   Family Psychiatric  History: unknown Social History:  Social History   Substance and Sexual Activity  Alcohol Use Not Currently   Comment: h/o abuse     Social History   Substance and Sexual Activity  Drug Use Not Currently   Types: Other-see comments   Comment: NYQUIL and benzos    Social History   Socioeconomic History   Marital status: Single    Spouse name: Not on file   Number of  children: 0   Years of education: Not on file   Highest education level: Not on file  Occupational History   Occupation: disability  Tobacco Use   Smoking status: Never   Smokeless tobacco: Never  Vaping Use   Vaping Use: Never used  Substance and Sexual Activity   Alcohol use: Not Currently    Comment: h/o abuse   Drug use: Not Currently    Types: Other-see comments    Comment: NYQUIL and  benzos   Sexual activity: Not on file  Other Topics Concern   Not on file  Social History Narrative   Not on file   Social Determinants of Health   Financial Resource Strain: Not on file  Food Insecurity: Not on file  Transportation Needs: Not on file  Physical Activity: Not on file  Stress: Not on file  Social Connections: Not on file   Additional Social History:    Allergies:  No Known Allergies  Labs:  Results for orders placed or performed during the hospital encounter of 10/09/21 (from the past 48 hour(s))  Comprehensive metabolic panel     Status: Abnormal   Collection Time: 10/09/21  4:17 PM  Result Value Ref Range   Sodium 140 135 - 145 mmol/L   Potassium 3.4 (L) 3.5 - 5.1 mmol/L   Chloride 108 98 - 111 mmol/L   CO2 24 22 - 32 mmol/L   Glucose, Bld 117 (H) 70 - 99 mg/dL    Comment: Glucose reference range applies only to samples taken after fasting for at least 8 hours.   BUN 11 6 - 20 mg/dL   Creatinine, Ser 4.09 0.61 - 1.24 mg/dL   Calcium 9.4 8.9 - 81.1 mg/dL   Total Protein 8.1 6.5 - 8.1 g/dL   Albumin 4.6 3.5 - 5.0 g/dL   AST 57 (H) 15 - 41 U/L   ALT 106 (H) 0 - 44 U/L   Alkaline Phosphatase 104 38 - 126 U/L   Total Bilirubin 0.9 0.3 - 1.2 mg/dL   GFR, Estimated >91 >47 mL/min    Comment: (NOTE) Calculated using the CKD-EPI Creatinine Equation (2021)    Anion gap 8 5 - 15    Comment: Performed at [facility], [address] [place], [place] [address]  cbc     Status: Abnormal   Collection Time: 10/09/21  4:17 PM  Result Value Ref Range   WBC 12.2 (H) 4.0 - 10.5 K/uL   RBC 5.86 (H) 4.22 - 5.81 MIL/uL   Hemoglobin 15.2 13.0 - 17.0 g/dL   HCT 21.3 08.6 - 57.8 %   MCV 80.4 80.0 - 100.0 fL   MCH 25.9 (L) 26.0 - 34.0 pg   MCHC 32.3 30.0 - 36.0 g/dL   RDW 46.9 62.9 - 52.8 %   Platelets 382 150 - 400 K/uL   nRBC 0.0 0.0 - 0.2 %    Comment: Performed at [facility], [address], [place], [place] [address]  Acetaminophen level      Status: Abnormal   Collection Time: 10/09/21  6:24 PM  Result Value Ref Range   Acetaminophen (Tylenol), Serum <10 (L) 10 - 30 ug/mL    Comment: (NOTE) Therapeutic concentrations vary significantly. A range of 10-30 ug/mL  may be an effective concentration for many patients. However, some  are best treated at concentrations outside of this range. Acetaminophen concentrations >150 ug/mL at 4 hours after ingestion  and >50 ug/mL at 12 hours after ingestion are  often associated with  toxic reactions.  Performed at [facility], [address] [place], [place] [address]   Salicylate level     Status: Abnormal   Collection Time: 10/09/21  6:24 PM  Result Value Ref Range   Salicylate Lvl <7.0 (L) 7.0 - 30.0 mg/dL    Comment: Performed at [facility], [address] [place], [place] [address]  Ethanol     Status: None   Collection Time: 10/09/21  6:24 PM  Result Value Ref Range   Alcohol, Ethyl (B) <10 <10 mg/dL    Comment: (NOTE) Lowest detectable limit for serum alcohol is 10 mg/dL.  For medical purposes only. Performed at [facility], [address] [place], [place] [address]   Blood gas, venous     Status: Abnormal   Collection Time: 10/09/21  6:25 PM  Result Value Ref Range   pH, Ven 7.4 7.25 - 7.43   pCO2, Ven 36 (L) 44 - 60 mmHg   pO2, Ven 61 (H) 32 - 45 mmHg   Bicarbonate 22.3 20.0 - 28.0 mmol/L   Acid-base deficit 2.0 0.0 - 2.0 mmol/L   O2 Saturation 90.7 %   Patient temperature 37.0    Collection site VEIN     Comment: Performed at [facility], [address], [place], [place] [address]  Acetaminophen level     Status: Abnormal   Collection Time: 10/09/21  9:04 PM  Result Value Ref Range   Acetaminophen (Tylenol), Serum <10 (L) 10 - 30 ug/mL    Comment: (NOTE) Therapeutic concentrations vary significantly. A range of 10-30 ug/mL  may be an effective concentration for many patients. However, some  are best treated at  concentrations outside of this range. Acetaminophen concentrations >150 ug/mL at 4 hours after ingestion  and >50 ug/mL at 12 hours after ingestion are often associated with  toxic reactions.  Performed at [facility], [address] [place], [place] [address]   Salicylate level     Status: Abnormal   Collection Time: 10/09/21  9:04 PM  Result Value Ref Range   Salicylate Lvl <7.0 (L) 7.0 - 30.0 mg/dL    Comment: Performed at [facility], [address] [place], [place] [address]  Comprehensive metabolic panel     Status: Abnormal   Collection Time: 10/09/21  9:04 PM  Result Value Ref Range   Sodium 140 135 - 145 mmol/L   Potassium 3.9 3.5 - 5.1 mmol/L   Chloride 111 98 - 111 mmol/L   CO2 23 22 - 32 mmol/L   Glucose, Bld 139 (H) 70 - 99 mg/dL    Comment: Glucose reference range applies only to samples taken after fasting for at least 8 hours.   BUN 11 6 - 20 mg/dL   Creatinine, Ser 8.12 0.61 - 1.24 mg/dL   Calcium 8.5 (L) 8.9 - 10.3 mg/dL   Total Protein 7.1 6.5 - 8.1 g/dL   Albumin 4.0 3.5 - 5.0 g/dL   AST 50 (H) 15 - 41 U/L   ALT 91 (H) 0 - 44 U/L   Alkaline Phosphatase 85 38 - 126 U/L   Total Bilirubin 0.7 0.3 - 1.2 mg/dL   GFR, Estimated >75 >17 mL/min    Comment: (NOTE) Calculated using the CKD-EPI Creatinine Equation (2021)    Anion gap 6 5 - 15    Comment: Performed at [facility], [address], [place], [place] [address]  Urine Drug Screen, Qualitative  Status: None   Collection Time: 10/09/21  9:57 PM  Result Value Ref Range   Tricyclic, Ur Screen NONE DETECTED NONE DETECTED   Amphetamines, Ur Screen NONE DETECTED NONE DETECTED   MDMA (Ecstasy)Ur Screen NONE DETECTED NONE DETECTED   Cocaine Metabolite,Ur [place] NONE DETECTED NONE DETECTED   Opiate, Ur Screen NONE DETECTED NONE DETECTED   Phencyclidine (PCP) Ur S NONE DETECTED NONE DETECTED   Cannabinoid 50 Ng, Ur [place] NONE DETECTED NONE DETECTED   Barbiturates, Ur Screen NONE  DETECTED NONE DETECTED   Benzodiazepine, Ur Scrn NONE DETECTED NONE DETECTED   Methadone Scn, Ur NONE DETECTED NONE DETECTED    Comment: (NOTE) Tricyclics + metabolites, urine    Cutoff 1000 ng/mL Amphetamines + metabolites, urine  Cutoff 1000 ng/mL MDMA (Ecstasy), urine              Cutoff 500 ng/mL Cocaine Metabolite, urine          Cutoff 300 ng/mL Opiate + metabolites, urine        Cutoff 300 ng/mL Phencyclidine (PCP), urine         Cutoff 25 ng/mL Cannabinoid, urine                 Cutoff 50 ng/mL Barbiturates + metabolites, urine  Cutoff 200 ng/mL Benzodiazepine, urine              Cutoff 200 ng/mL Methadone, urine                   Cutoff 300 ng/mL  The urine drug screen provides only a preliminary, unconfirmed analytical test result and should not be used for non-medical purposes. Clinical consideration and professional judgment should be applied to any positive drug screen result due to possible interfering substances. A more specific alternate chemical method must be used in order to obtain a confirmed analytical result. Gas chromatography / mass spectrometry (GC/MS) is the preferred confirm atory method. Performed at [facility], [address] [place], [place] [id number]     Current Facility-Administered Medications  Medication Dose Route Frequency Provider Last Rate Last Admin   melatonin tablet 5 mg  5 mg Oral QHS Sharyn CreamerQuale, Mark, MD   5 mg at 10/09/21 2241   Current Outpatient Medications  Medication Sig Dispense Refill   DULoxetine (CYMBALTA) 60 MG capsule Take 60 mg by mouth daily.     hydrOXYzine (VISTARIL) 50 MG capsule Take 50 mg by mouth 2 (two) times daily as needed.     lamoTRIgine (LAMICTAL) 100 MG tablet Take 100 mg by mouth daily.     lithium 300 MG tablet Take 450 mg by mouth daily.     Oxcarbazepine (TRILEPTAL) 300 MG tablet Take 300 mg by mouth 2 (two) times daily.     traZODone (DESYREL) 50 MG tablet Take 50 mg by mouth at bedtime.       Musculoskeletal: Strength & Muscle Tone: within normal limits Gait & Station: normal Patient leans: N/A  Psychiatric Specialty Exam:  Presentation  General Appearance: Casual  Eye Contact:Good  Speech:Clear and Coherent  Speech Volume:Decreased  Handedness:Right   Mood and Affect  Mood:Dysphoric  Affect:Congruent   Thought Process  Thought Processes:Coherent  Descriptions of Associations:Intact  Orientation:Full (Time, Place and Person)  Thought Content:WDL; Logical  History of Schizophrenia/Schizoaffective disorder:No  Duration of Psychotic Symptoms:No data recorded Hallucinations:Hallucinations: None  Ideas of Reference:None  Suicidal Thoughts:Suicidal Thoughts: Yes, Active SI Active Intent and/or Plan: With Intent  Homicidal Thoughts:Homicidal Thoughts: No  Sensorium  Memory:Immediate Fair  Judgment:Impaired  Insight:Poor   Executive Functions  Concentration:No data recorded Attention Span:Fair  [place]   Psychomotor Activity  Psychomotor Activity:Psychomotor Activity: Normal   Assets  Assets:Communication Skills; Housing; Catering manager; Leisure Time; Social Support   Sleep  Sleep:Sleep: Fair   Physical Exam: Physical Exam Vitals and nursing note reviewed.  HENT:     Head: Normocephalic and atraumatic.     Nose: Nose normal.     Mouth/Throat:     Mouth: Mucous membranes are moist.  Eyes:     Pupils: Pupils are equal, round, and reactive to light.  Pulmonary:     Effort: Pulmonary effort is normal.  Musculoskeletal:        General: Normal range of motion.     Cervical back: Normal range of motion.  Skin:    General: Skin is warm and dry.  Neurological:     General: No focal deficit present.     Mental Status: He is alert and oriented to person, place, and time.  Psychiatric:        Attention and Perception: Attention and perception normal.        Mood  and Affect: Mood and affect normal.        Speech: Speech normal.        Behavior: Behavior normal. Behavior is cooperative.        Thought Content: Thought content normal.        Cognition and Memory: Cognition and memory normal.        Judgment: Judgment is inappropriate.   Review of Systems  Psychiatric/Behavioral:  Positive for substance abuse and suicidal ideas. Negative for hallucinations.   All other systems reviewed and are negative.  Blood pressure 121/70, pulse 97, temperature 97.9 F (36.6 C), temperature source Oral, resp. rate 20, height 5\' 10"  (1.778 m), weight 97.5 kg, SpO2 97 %. Body mass index is 30.85 kg/m.  Treatment Plan Summary: Daily contact with patient to assess and evaluate symptoms and progress in treatment and Medication management  Disposition: Recommend psychiatric Inpatient admission when medically cleared. Supportive therapy provided about ongoing stressors. Discussed crisis plan, support from social network, calling 911, coming to the Emergency Department, and calling Suicide Hotline.  Deloria Lair, NP 10/10/2021 1:59 AM

## 2021-10-10 NOTE — ED Notes (Signed)
Belongings given to pt father

## 2021-10-10 NOTE — BH Assessment (Signed)
Referral information for Psychiatric Hospitalization faxed to;  [facility] ([phone]-or- [phone]),   [facility] ([phone]),  [facility] ([phone]),   [facility] ([phone] -or- [phone]),   [facility] ([phone])  [facility] ([phone])  

## 2021-10-10 NOTE — ED Notes (Signed)
Provided with phone.

## 2021-10-10 NOTE — ED Notes (Signed)
Poison control- Wilkie Aye [phone]) provided with update, vital signs and EKG WDL.

## 2021-10-10 NOTE — BH Assessment (Signed)
Comprehensive Clinical Assessment (CCA) Note  10/10/2021 Jorge Hanna [id number]  Chief Complaint: Patient is a 31 year old male presenting to [facility] ED under IVC. Per triage note Patient states he has been feeling suicidal today. States he has been drinking BANG energy drinks with 30 plus Cymbalta and other unnamed med. Patient states he was feeling suicidal and planned to his life by this. Patient tachy in triage. During assessment patient appears alert and oriented x4, calm and cooperative. 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 Patient reports that this is not his first attempt "I attempted in December, I took 18 of my anti-depressants." Patient reports currently having a outpatient psychiatrist and has been diagnosed with Bipolar and Depression. Patient also reports being hospitalized as a child and in 2021. Patient denies HI/AH/VH.  Per Psyc NP Anette Riedel patient is recommended for Inpatient Chief Complaint  Patient presents with   Suicide Attempt   Suicidal   Visit Diagnosis: Major Depressive Disorder, recurrent episode, severe    CCA Screening, Triage and Referral (STR)  Patient Reported Information How did you hear about [place]? Legal System  Referral name: No data recorded Referral phone number: No data recorded  Whom do you see for routine medical problems? No data recorded Practice/Facility Name: No data recorded Practice/Facility Phone Number: No  data recorded Name of Contact: No data recorded Contact Number: No data recorded Contact Fax Number: No data recorded Prescriber Name: No data recorded Prescriber Address (if known): No data recorded  What Is the Reason for Your Visit/Call Today? Patient presents under IVC due to a suicide attempt  How Long Has This Been Causing You Problems? > than 6 months  What Do You Feel Would Help You the Most Today? No data recorded  Have You Recently Been in Any Inpatient Treatment (Hospital/Detox/Crisis Center/28-Day Program)? No data recorded Name/Location of Program/Hospital:No data recorded How Long Were You There? No data recorded When Were You Discharged? No data recorded  Have You Ever Received Services From [facility] Before? No data recorded Who Do You See at [facility]? No data recorded  Have You Recently Had Any Thoughts About Hurting Yourself? Yes  Are You Planning to Commit Suicide/Harm Yourself At This time? No   Have you Recently Had Thoughts About [place]? No  Explanation: No data recorded  Have You Used Any Alcohol or Drugs in the Past 24 Hours? No  How Long Ago Did You Use Drugs or Alcohol? No data recorded What Did You Use and How Much? No data recorded  Do You Currently Have a Therapist/Psychiatrist? Yes  Name of Therapist/Psychiatrist: Patient has a psychiatrist. Marena Chancy of what facility patient goes to   Have You Been Recently Discharged From Any Office Practice or Programs? No  Explanation of Discharge From Practice/Program: No data recorded    CCA Screening Triage Referral Assessment Type of Contact: Face-to-Face  Is this Initial or Reassessment? No data recorded Date Telepsych  consult ordered in [facility]:  No data recorded Time Telepsych consult ordered in [facility]:  No data recorded  Patient Reported Information Reviewed? No data recorded Patient Left Without Being Seen? No data recorded Reason for Not Completing Assessment: No data  recorded  Collateral Involvement: No data recorded  Does Patient Have a Court Appointed Legal Guardian? No data recorded Name and Contact of Legal Guardian: No data recorded If Minor and Not Living with Parent(s), Who has Custody? No data recorded Is CPS involved or ever been involved? Never  Is APS involved or ever been involved? Never   Patient Determined To Be At Risk for Harm To Self or Others Based on Review of Patient Reported Information or Presenting Complaint? Yes, for Self-Harm  Method: No data recorded Availability of Means: No data recorded Intent: No data recorded Notification Required: No data recorded Additional Information for Danger to Others Potential: No data recorded Additional Comments for Danger to Others Potential: No data recorded Are There Guns or Other Weapons in Your Home? No data recorded Types of Guns/Weapons: No data recorded Are These Weapons Safely Secured?                            No data recorded Who Could Verify You Are Able To Have These Secured: No data recorded Do You Have any Outstanding Charges, Pending Court Dates, Parole/Probation? No data recorded Contacted To Inform of Risk of Harm To Self or Others: No data recorded  Location of Assessment: [facility] ED   Does Patient Present under Involuntary Commitment? Yes  IVC Papers Initial File Date: 10/09/21   [place] of Residence:    Patient Currently Receiving the Following Services: Medication Management   Determination of Need: Emergent (2 hours)   Options For Referral: No data recorded    CCA Biopsychosocial Intake/Chief Complaint:  No data recorded Current Symptoms/Problems: No data recorded  Patient Reported Schizophrenia/Schizoaffective Diagnosis in Past: No   Strengths: Patient is able to communicate  Preferences: No data recorded Abilities: No data recorded  Type of Services Patient Feels are Needed: No data recorded  Initial Clinical Notes/Concerns: No  data recorded  Mental Health Symptoms Depression:   Change in energy/activity; Difficulty Concentrating; Sleep (too much or little)   Duration of Depressive symptoms:  Greater than two weeks   Mania:   Increased Energy; Racing thoughts   Anxiety:    Restlessness; Sleep   Psychosis:   None   Duration of Psychotic symptoms: No data recorded  Trauma:   None   Obsessions:   None   Compulsions:   None   Inattention:   None   Hyperactivity/Impulsivity:   None   Oppositional/Defiant Behaviors:   None   Emotional Irregularity:   None   Other Mood/Personality Symptoms:  No data recorded   Mental Status Exam Appearance and self-care  Stature:   Average   Weight:   Average weight   Clothing:   Casual   Grooming:   Normal   Cosmetic use:   None   Posture/gait:   Normal   Motor activity:   Not Remarkable   Sensorium  Attention:   Normal   Concentration:   Normal   Orientation:   X5   Recall/memory:   Normal   Affect and Mood  Affect:   Appropriate   Mood:   Other (Comment)   Relating  Eye contact:   Normal   Facial expression:   Responsive  Attitude toward examiner:   Cooperative   Thought and Language  Speech flow:  Clear and Coherent   Thought content:   Appropriate to Mood and Circumstances   Preoccupation:   None   Hallucinations:   None   Organization:  No data recorded  [employer] of Knowledge:   Fair   Intelligence:   Average   Abstraction:   Normal   Judgement:   Fair   Reality Testing:   Adequate   Insight:   Fair   Decision Making:   Impulsive   Social Functioning  Social Maturity:   Responsible   Social Judgement:   Normal   Stress  Stressors:   Other (Comment)   Coping Ability:   Exhausted   Skill Deficits:   Intellect/education   Supports:   Family; Friends/Service system     Religion: Religion/Spirituality Are You A Religious Person?:  No  Leisure/Recreation: Leisure / Recreation Do You Have Hobbies?: No  Exercise/Diet: Exercise/Diet Do You Exercise?: No Have You Gained or Lost A Significant Amount of Weight in the Past Six Months?: No Do You Follow a Special Diet?: No Do You Have Any Trouble Sleeping?: No   CCA Employment/Education Employment/Work Situation: Employment / Work Systems developer: On disability Why is Patient on Disability: Mental Healt/ Autistic How Long has Patient Been on Disability: Unknown Has Patient ever Been in the [employer]?: No  Education: Education Is Patient Currently Attending School?: No Did You Have An Individualized Education Program (IIEP): No Did You Have Any Difficulty At School?: No Patient's Education Has Been Impacted by Current Illness: No   CCA Family/Childhood History Family and Relationship History: Family history Marital status: Single Does patient have children?: No  Childhood History:  Childhood History By whom was/is the patient raised?: Mother Did patient suffer any verbal/emotional/physical/sexual abuse as a child?: No Did patient suffer from severe childhood neglect?: No Has patient ever been sexually abused/assaulted/raped as an adolescent or adult?: No Was the patient ever a victim of a crime or a disaster?: No Witnessed domestic violence?: No Has patient been affected by domestic violence as an adult?: No  Child/Adolescent Assessment:     CCA Substance Use Alcohol/Drug Use: Alcohol / Drug Use Pain Medications: See MAR Prescriptions: See MAR Over the Counter: See MAR History of alcohol / drug use?: No history of alcohol / drug abuse (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)                         ASAM's:  Six Dimensions of Multidimensional Assessment  Dimension 1:  Acute Intoxication and/or Withdrawal Potential:   Dimension 1:  Description of individual's past and current experiences of substance use and  withdrawal: 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  Dimension 2:  Biomedical Conditions and Complications:      Dimension 3:  Emotional, Behavioral, or Cognitive Conditions and Complications:     Dimension 4:  Readiness to Change:     Dimension 5:  Relapse, Continued use, or Continued Problem Potential:     Dimension 6:  Recovery/Living Environment:     ASAM Severity Score:    ASAM Recommended Level of Treatment:     Substance use Disorder (SUD)    Recommendations for Services/Supports/Treatments:    DSM5 Diagnoses: Patient Active Problem List   Diagnosis Date Noted   Chronic fatigue 04/14/2020   Autistic disorder    Bipolar 1 disorder (HCC)    Substance abuse (HCC)  Depression    Anxiety     Patient Centered Plan: Patient is on the following Treatment Plan(s):  Depression   Referrals to Alternative Service(s): Referred to Alternative Service(s):   Place:   Date:   Time:    Referred to Alternative Service(s):   Place:   Date:   Time:    Referred to Alternative Service(s):   Place:   Date:   Time:    Referred to Alternative Service(s):   Place:   Date:   Time:      @BHCOLLABOFCARE @  [employer], LCAS-A

## 2021-10-10 NOTE — ED Notes (Signed)
Pt given lunch tray and beverage 

## 2021-10-10 NOTE — ED Notes (Signed)
IVC/pending inpatient admission when medically cleared

## 2021-10-10 NOTE — BH Assessment (Addendum)
Patient has been accepted to [facility] on today 10/10/21.   Patient was not assigned to a room.  Accepting physician is Dr. Nolon Rod.  Call report to [phone].  Representative was [employer].   ER Staff is aware of it:  Luann, ER Secretary  Dr. Darnelle Catalan, ER MD  Florentina Addison, Patient's Nurse     Patient's Family/Support System [phone]- Mom, Rush Salce) has been updated as well.  [facility] [address] [place], [place] [address]

## 2021-12-25 NOTE — Progress Notes (Signed)
Jorge Hanna,Sulibeya S Dimas,acting as a Education administrator for Lavon Paganini, MD.,have documented all relevant documentation on the behalf of Lavon Paganini, MD,as directed by  Lavon Paganini, MD while in the presence of Lavon Paganini, MD.    Complete physical exam   Patient: Jorge Hanna   DOB: [birth year]   31 y.o. Male  MRN: [id number] Visit Date: 12/28/2021  Today's healthcare provider: Lavon Paganini, MD   Chief Complaint  Patient presents with   Annual Exam   Subjective    Jorge Hanna is a 31 y.o. male who presents today for a complete physical exam.  He reports consuming a general diet. Home exercise routine includes walking. He generally feels well. He reports sleeping well. He does not have additional problems to discuss today.  HPI   Suicide attempt back in 09/2021. Seeing a therapist. Mood is good.  Stopped drinking energy drinking.  Past Medical History:  Diagnosis Date   Anxiety    Autistic disorder    Bipolar 1 disorder ([place])    Depression    Substance abuse ([place])    Past Surgical History:  Procedure Laterality Date   NO PAST SURGERIES     Social History   Socioeconomic History   Marital status: Single    Spouse name: Not on file   Number of children: 0   Years of education: Not on file   Highest education level: Not on file  Occupational History   Occupation: disability  Tobacco Use   Smoking status: Never   Smokeless tobacco: Never  Vaping Use   Vaping Use: Never used  Substance and Sexual Activity   Alcohol use: Not Currently    Comment: h/o abuse   Drug use: Not Currently    Types: Other-see comments    Comment: NYQUIL and benzos   Sexual activity: Not on file  Other Topics Concern   Not on file  Social History Narrative   Not on file   Social Determinants of Health   Financial Resource Strain: Not on file  Food Insecurity: Not on file  Transportation Needs: Not on file  Physical Activity: Not on file  Stress: Not on file   Social Connections: Not on file  Intimate Partner Violence: Not on file   No family status information on file.   Family History  Adopted: Yes   No Known Allergies  Patient Care Team: Virginia Crews, MD as PCP - General (Family Medicine)   Medications: Outpatient Medications Prior to Visit  Medication Sig   DULoxetine (CYMBALTA) 60 MG capsule Take 60 mg by mouth daily.   lithium 300 MG tablet Take 450 mg by mouth daily.   Oxcarbazepine (TRILEPTAL) 300 MG tablet Take 300 mg by mouth 2 (two) times daily.   [DISCONTINUED] hydrOXYzine (VISTARIL) 50 MG capsule Take 50 mg by mouth 2 (two) times daily as needed.   [DISCONTINUED] lamoTRIgine (LAMICTAL) 100 MG tablet Take 100 mg by mouth daily.   [DISCONTINUED] traZODone (DESYREL) 50 MG tablet Take 50 mg by mouth at bedtime.   No facility-administered medications prior to visit.    Review of Systems  All other systems reviewed and are negative.   Last CBC Lab Results  Component Value Date   WBC 12.2 (H) 10/09/2021   HGB 15.2 10/09/2021   HCT 47.1 10/09/2021   MCV 80.4 10/09/2021   MCH 25.9 (L) 10/09/2021   RDW 13.7 10/09/2021   PLT 382 [id number]   Last metabolic panel Lab Results  Component Value Date  GLUCOSE 139 (H) 10/09/2021   NA 140 10/09/2021   K 3.9 10/09/2021   CL 111 10/09/2021   CO2 23 10/09/2021   BUN 11 10/09/2021   CREATININE 1.07 10/09/2021   GFRNONAA >60 10/09/2021   CALCIUM 8.5 (L) 10/09/2021   PROT 7.1 10/09/2021   ALBUMIN 4.0 10/09/2021   LABGLOB 2.6 04/15/2020   AGRATIO 2.0 04/15/2020   BILITOT 0.7 10/09/2021   ALKPHOS 85 10/09/2021   AST 50 (H) 10/09/2021   ALT 91 (H) 10/09/2021   ANIONGAP 6 10/09/2021   Last lipids No results found for: "CHOL", "HDL", "LDLCALC", "LDLDIRECT", "TRIG", "CHOLHDL" Last hemoglobin A1c Lab Results  Component Value Date   HGBA1C 5.3 06/10/2020   Last thyroid functions Lab Results  Component Value Date   TSH 1.670 04/15/2020   Last vitamin D Lab  Results  Component Value Date   VD25OH 5.5 (L) 06/10/2020   Last vitamin B12 and Folate Lab Results  Component Value Date   VITAMINB12 601 06/10/2020      Objective    BP 126/83 (BP Location: Left Arm, Patient Position: Sitting, Cuff Size: Large)   Pulse 98   Temp 98 F (36.7 C) (Oral)   Resp 16   Ht 5\' 9"  (1.753 m)   Wt 222 lb (100.7 kg)   BMI 32.78 kg/m  BP Readings from Last 3 Encounters:  12/28/21 126/83  10/10/21 130/71  01/31/20 (!) 141/84   Wt Readings from Last 3 Encounters:  12/28/21 222 lb (100.7 kg)  10/09/21 215 lb (97.5 kg)  04/14/20 200 lb (90.7 kg)    Physical Exam Vitals reviewed.  Constitutional:      General: He is not in acute distress.    Appearance: Normal appearance. He is well-developed. He is not diaphoretic.  HENT:     Head: Normocephalic and atraumatic.     Right Ear: Tympanic membrane, ear canal and external ear normal.     Left Ear: Tympanic membrane, ear canal and external ear normal.     Nose: Nose normal.     Mouth/Throat:     Mouth: Mucous membranes are moist.     Pharynx: Oropharynx is clear. No oropharyngeal exudate.  Eyes:     General: No scleral icterus.    Conjunctiva/sclera: Conjunctivae normal.     Pupils: Pupils are equal, round, and reactive to light.  Neck:     Thyroid: No thyromegaly.  Cardiovascular:     Rate and Rhythm: Normal rate and regular rhythm.     Pulses: Normal pulses.     Heart sounds: Normal heart sounds. No murmur heard. Pulmonary:     Effort: Pulmonary effort is normal. No respiratory distress.     Breath sounds: Normal breath sounds. No wheezing or rales.  Abdominal:     General: There is no distension.     Palpations: Abdomen is soft.     Tenderness: There is no abdominal tenderness.  Musculoskeletal:        General: No deformity.     Cervical back: Neck supple.     Right lower leg: No edema.     Left lower leg: No edema.  Lymphadenopathy:     Cervical: No cervical adenopathy.  Skin:     General: Skin is warm and dry.     Findings: No rash.  Neurological:     Mental Status: He is alert and oriented to person, place, and time. Mental status is at baseline.     Sensory: No sensory deficit.  Motor: No weakness.     Gait: Gait normal.  Psychiatric:        Mood and Affect: Mood normal.        Behavior: Behavior normal.        Thought Content: Thought content normal.       Last depression screening scores    12/28/2021    2:03 PM 04/14/2020   11:07 AM  PHQ 2/9 Scores  PHQ - 2 Score 0   PHQ- 9 Score 0   Exception Documentation  Other- indicate reason in comment box   Last fall risk screening    12/28/2021    2:03 PM  [place] in the past year? 0  Number falls in past yr: 0  Injury with Fall? 0  Risk for fall due to : No Fall Risks  Follow up Falls evaluation completed   Last Audit-C alcohol use screening    12/28/2021    2:04 PM  Alcohol Use Disorder Test (AUDIT)  1. How often do you have a drink containing alcohol? 0  2. How many drinks containing alcohol do you have on a typical day when you are drinking? 0  3. How often do you have six or more drinks on one occasion? 0  AUDIT-C Score 0   A score of 3 or more in women, and 4 or more in men indicates increased risk for alcohol abuse, EXCEPT if all of the points are from question 1   No results found for any visits on 12/28/21.  Assessment & Plan    Routine Health Maintenance and Physical Exam  Exercise Activities and Dietary recommendations  Goals   None     Immunization History  Administered Date(s) Administered   Tdap 03/29/2019    Health Maintenance  Topic Date Due   COVID-19 Vaccine (1) Never done   Hepatitis C Screening  Never done   INFLUENZA VACCINE  06/20/2022 (Originally 10/20/2021)   TETANUS/TDAP  03/28/2029   HIV Screening  Completed   HPV VACCINES  Aged Out    Discussed health benefits of physical activity, and encouraged him to engage in regular exercise  appropriate for his age and condition.  Problem List Items Addressed This Visit   None Visit Diagnoses     Encounter for annual physical exam    -  Primary   Relevant Orders   Hepatitis C Antibody   HIV antibody (with reflex)   Hemoglobin A1c   Lipid panel   Comprehensive metabolic panel   Need for hepatitis C screening test       Relevant Orders   Hepatitis C Antibody   Encounter for screening for HIV       Relevant Orders   HIV antibody (with reflex)   Transaminitis       Relevant Orders   Comprehensive metabolic panel   Hyperglycemia       Relevant Orders   Hemoglobin A1c   Class 1 obesity without serious comorbidity with body mass index (BMI) of 32.0 to 32.9 in adult, unspecified obesity type       Relevant Orders   Hemoglobin A1c   Lipid panel   Comprehensive metabolic panel        Return in about 1 year (around 12/29/2022) for CPE.     Jorge Hanna, Lavon Paganini, MD, have reviewed all documentation for this visit. The documentation on 12/28/21 for the exam, diagnosis, procedures, and orders are all accurate and complete.   Virginia Crews,  MD, MPH [facility]

## 2021-12-28 ENCOUNTER — Ambulatory Visit: Payer: Medicaid Other | Admitting: Family Medicine

## 2021-12-28 ENCOUNTER — Encounter: Payer: Self-pay | Admitting: Family Medicine

## 2021-12-28 VITALS — BP 126/83 | HR 98 | Temp 98.0°F | Resp 16 | Ht 69.0 in | Wt 222.0 lb

## 2021-12-28 DIAGNOSIS — R7401 Elevation of levels of liver transaminase levels: Secondary | ICD-10-CM | POA: Diagnosis not present

## 2021-12-28 DIAGNOSIS — Z0000 Encounter for general adult medical examination without abnormal findings: Secondary | ICD-10-CM | POA: Diagnosis not present

## 2021-12-28 DIAGNOSIS — Z114 Encounter for screening for human immunodeficiency virus [HIV]: Secondary | ICD-10-CM

## 2021-12-28 DIAGNOSIS — E669 Obesity, unspecified: Secondary | ICD-10-CM

## 2021-12-28 DIAGNOSIS — Z1159 Encounter for screening for other viral diseases: Secondary | ICD-10-CM

## 2021-12-28 DIAGNOSIS — R739 Hyperglycemia, unspecified: Secondary | ICD-10-CM

## 2021-12-28 DIAGNOSIS — Z6832 Body mass index (BMI) 32.0-32.9, adult: Secondary | ICD-10-CM

## 2021-12-29 ENCOUNTER — Telehealth: Payer: Self-pay

## 2021-12-29 LAB — COMPREHENSIVE METABOLIC PANEL
ALT: 159 IU/L — ABNORMAL HIGH (ref 0–44)
AST: 148 IU/L — ABNORMAL HIGH (ref 0–40)
Albumin/Globulin Ratio: 2 (ref 1.2–2.2)
Albumin: 5.1 g/dL (ref 4.3–5.2)
Alkaline Phosphatase: 132 IU/L — ABNORMAL HIGH (ref 44–121)
BUN/Creatinine Ratio: 14 (ref 9–20)
BUN: 14 mg/dL (ref 6–20)
Bilirubin Total: 0.4 mg/dL (ref 0.0–1.2)
CO2: 23 mmol/L (ref 20–29)
Calcium: 10.2 mg/dL (ref 8.7–10.2)
Chloride: 101 mmol/L (ref 96–106)
Creatinine, Ser: 1 mg/dL (ref 0.76–1.27)
Globulin, Total: 2.6 g/dL (ref 1.5–4.5)
Glucose: 92 mg/dL (ref 70–99)
Potassium: 5 mmol/L (ref 3.5–5.2)
Sodium: 140 mmol/L (ref 134–144)
Total Protein: 7.7 g/dL (ref 6.0–8.5)
eGFR: 104 mL/min/{1.73_m2} (ref >59)

## 2021-12-29 LAB — HEMOGLOBIN A1C
Est. average glucose Bld gHb Est-mCnc: 108 mg/dL
Hgb A1c MFr Bld: 5.4 % (ref 4.8–5.6)

## 2021-12-29 LAB — LIPID PANEL
Chol/HDL Ratio: 7.5 ratio — ABNORMAL HIGH (ref 0.0–5.0)
Cholesterol, Total: 202 mg/dL — ABNORMAL HIGH (ref 100–199)
HDL: 27 mg/dL — ABNORMAL LOW (ref >39)
LDL Chol Calc (NIH): 137 mg/dL — ABNORMAL HIGH (ref 0–99)
Triglycerides: 209 mg/dL — ABNORMAL HIGH (ref 0–149)
VLDL Cholesterol Cal: 38 mg/dL (ref 5–40)

## 2021-12-29 LAB — HEPATITIS C ANTIBODY: Hep C Virus Ab: NONREACTIVE

## 2021-12-29 LAB — HIV ANTIBODY (ROUTINE TESTING W REFLEX): HIV Screen 4th Generation wRfx: NONREACTIVE

## 2021-12-29 NOTE — Telephone Encounter (Signed)
-----   Message from Virginia Crews, MD sent at 12/29/2021  1:24 PM EDT ----- [id number] and [id number] according to epic. Thanks! ----- Message ----- From: Smitty Knudsen, CMA Sent: 12/29/2021  12:51 PM EDT To: Virginia Crews, MD  I called [facility] to add on hep panel and ANA but they need exact test codes. Do you know what they are?  ----- Message ----- From: Virginia Crews, MD Sent: 12/29/2021   8:01 AM EDT To: Brita Romp Nurse  Normal/stable labs, except worsening of liver function tests.  Recommend adding on a hepatitis panel and ANA to the labs and getting a RUQ ultrasound to evaluate the liver.  Hydrate well and avoid tylenol and alcohol.  Recommend GI referral also.  Cholesterol slightly elevated-recommend diet low in saturated fat and regular exercise - 30 min at least 5 times per week

## 2021-12-29 NOTE — Addendum Note (Signed)
Addended by: Smitty Knudsen on: 12/29/2021 11:33 AM   Modules accepted: Orders

## 2021-12-31 ENCOUNTER — Other Ambulatory Visit: Payer: Self-pay | Admitting: Family Medicine

## 2021-12-31 DIAGNOSIS — R7401 Elevation of levels of liver transaminase levels: Secondary | ICD-10-CM

## 2021-12-31 LAB — ANA W/REFLEX IF POSITIVE: Anti Nuclear Antibody (ANA): NEGATIVE

## 2021-12-31 LAB — SPECIMEN STATUS REPORT

## 2021-12-31 LAB — HEPATIC FUNCTION PANEL
ALT: 166 IU/L — ABNORMAL HIGH (ref 0–44)
AST: 146 IU/L — ABNORMAL HIGH (ref 0–40)
Albumin: 5.1 g/dL (ref 4.3–5.2)
Alkaline Phosphatase: 136 IU/L — ABNORMAL HIGH (ref 44–121)
Bilirubin Total: 0.3 mg/dL (ref 0.0–1.2)
Bilirubin, Direct: 0.12 mg/dL (ref 0.00–0.40)
Total Protein: 7.9 g/dL (ref 6.0–8.5)

## 2022-01-08 LAB — HEPATITIS B SURFACE ANTIGEN: Hepatitis B Surface Ag: NEGATIVE

## 2022-01-08 LAB — HEPATITIS C ANTIBODY: Hep C Virus Ab: NONREACTIVE

## 2022-01-08 LAB — HEPATITIS A ANTIBODY, TOTAL: hep A Total Ab: POSITIVE — AB

## 2022-01-08 LAB — HEPATITIS B CORE AB W/REFLEX: Hep B Core Total Ab: NEGATIVE

## 2022-01-11 ENCOUNTER — Ambulatory Visit
Admission: RE | Admit: 2022-01-11 | Discharge: 2022-01-11 | Disposition: A | Discharge status: Home / Self Care | Payer: Medicaid Other | Source: Ambulatory Visit | Attending: Family Medicine | Admitting: Family Medicine

## 2022-01-11 DIAGNOSIS — R7401 Elevation of levels of liver transaminase levels: Secondary | ICD-10-CM | POA: Diagnosis present

## 2022-02-23 ENCOUNTER — Telehealth: Payer: Self-pay | Admitting: Family Medicine

## 2022-02-23 NOTE — Telephone Encounter (Signed)
Pt mother stated that [facility] is no longer able to care for him because he has autism. They are working to get him in with someone else. In between, they are going to drop him and need someone to prescribe his medications. The patient's mother stated she was asked to reach out to the PCP to see if there is a possibility Dr. B can prescribe his psychiatric medications in the interim.   Pt is on the waitlist for [place] [facility]  Pt medication lithium / Oxcarbazepine / DULoxetine [facility]  Please advise.

## 2022-02-25 NOTE — Telephone Encounter (Signed)
Patients mother advised  

## 2022-02-25 NOTE — Telephone Encounter (Signed)
I can continue his chronic stable psych meds in the interim.  Do recommend in person/virtual visit so we can get lab monitoring. He should get records from [facility] sent to [place] (we can send ROI if needed).

## 2022-02-26 ENCOUNTER — Encounter: Payer: Self-pay | Admitting: Family Medicine

## 2022-03-17 NOTE — Progress Notes (Signed)
I,Joseline E Rosas,acting as a scribe for Shirlee Latch, MD.,have documented all relevant documentation on the behalf of Shirlee Latch, MD,as directed by  Shirlee Latch, MD while in the presence of Shirlee Latch, MD.   Established patient visit   Patient: Jorge Hanna   DOB: 17-Nov-1990   31 y.o. Male  MRN: [id number] Visit Date: 03/23/2022  Today's healthcare provider: Shirlee Latch, MD   Chief Complaint  Patient presents with   Follow-up   Subjective    HPI  Follow up for bipolar disorder  The patient was last seen for this at [facility]. Patients mother reports [facility] can no longer care for him due to autism. They are working to get him in with someone else. Patient is on wait list for [facility].   Patient reports that he doing well with Duloxetine, Lithium and Trileptal.     Medications: Outpatient Medications Prior to Visit  Medication Sig   [DISCONTINUED] DULoxetine (CYMBALTA) 60 MG capsule Take 60 mg by mouth daily.   [DISCONTINUED] lithium 300 MG tablet Take 450 mg by mouth daily.   [DISCONTINUED] Oxcarbazepine (TRILEPTAL) 300 MG tablet Take 300 mg by mouth 2 (two) times daily.   No facility-administered medications prior to visit.    Review of Systems per HPI     Objective    BP 138/84 (BP Location: Left Arm, Patient Position: Sitting, Cuff Size: Large)   Pulse 88   Temp 98.3 F (36.8 C) (Oral)   Resp 16   Ht 5\' 9"  (1.753 m)   Wt 221 lb 8 oz (100.5 kg)   BMI 32.71 kg/m    Physical Exam Vitals reviewed.  Constitutional:      General: He is not in acute distress.    Appearance: Normal appearance. He is not diaphoretic.  HENT:     Head: Normocephalic and atraumatic.  Eyes:     General: No scleral icterus.    Conjunctiva/sclera: Conjunctivae normal.  Cardiovascular:     Rate and Rhythm: Normal rate and regular rhythm.     Pulses: Normal pulses.     Heart sounds: Normal heart sounds. No murmur heard. Pulmonary:     Effort:  Pulmonary effort is normal. No respiratory distress.     Breath sounds: Normal breath sounds. No wheezing or rhonchi.  Musculoskeletal:     Cervical back: Neck supple.     Right lower leg: No edema.     Left lower leg: No edema.  Lymphadenopathy:     Cervical: No cervical adenopathy.  Skin:    General: Skin is warm and dry.     Findings: No rash.  Neurological:     Mental Status: He is alert and oriented to person, place, and time. Mental status is at baseline.  Psychiatric:        Mood and Affect: Mood normal.        Behavior: Behavior normal.       No results found for any visits on 03/23/22.  Assessment & Plan     Problem List Items Addressed This Visit       Other   Autistic disorder    Lives alone, but mother is his legal guardian Was told by previous psychiatrist that he could no longer be managed there due to his autism He is looking for a new psychiatrist at this time      Bipolar 1 disorder (HCC) - Primary    Chronic and well-controlled Previously followed by psychiatry at [facility] Was told  he can no longer be seen there due to autism Is looking for a new psychiatrist at this point While waiting to get an appointment, I will manage medications No changes to medications at this time as he is well-controlled Will check monitoring labs      Relevant Orders   Lithium level   CBC w/Diff/Platelet   Comprehensive metabolic panel   History of substance abuse ([place])    History of polysubstance abuse, including alcohol, NyQuil, Tylenol, benzos Reports not using anything other than his prescriptions from psychiatry at this time Has not used alcohol in over 15 months Will avoid controlled substances      Depression    Chronic and well-controlled Previously followed by psychiatry at [facility] Was told he can no longer be seen there due to autism Is looking for a new psychiatrist at this point While waiting to get an appointment, I will manage medications No changes to  medications at this time as he is well-controlled Will check monitoring labs      Relevant Medications   DULoxetine (CYMBALTA) 60 MG capsule   Anxiety    Chronic and well-controlled Previously followed by psychiatry at [facility] Was told he can no longer be seen there due to autism Is looking for a new psychiatrist at this point While waiting to get an appointment, I will manage medications No changes to medications at this time as he is well-controlled Will check monitoring labs      Relevant Medications   DULoxetine (CYMBALTA) 60 MG capsule   Other Visit Diagnoses     Long-term use of high-risk medication       Relevant Orders   Lithium level   CBC w/Diff/Platelet   Comprehensive metabolic panel        Return in about 4 months (around 07/22/2022) for chronic disease f/u.      I, Lavon Paganini, MD, have reviewed all documentation for this visit. The documentation on 03/23/22 for the exam, diagnosis, procedures, and orders are all accurate and complete.   Leiani Enright, Dionne Bucy, MD, MPH [facility]

## 2022-03-23 ENCOUNTER — Ambulatory Visit (INDEPENDENT_AMBULATORY_CARE_PROVIDER_SITE_OTHER): Payer: Medicaid Other | Admitting: Family Medicine

## 2022-03-23 ENCOUNTER — Encounter: Payer: Self-pay | Admitting: Family Medicine

## 2022-03-23 VITALS — BP 138/84 | HR 88 | Temp 98.3°F | Resp 16 | Ht 69.0 in | Wt 221.5 lb

## 2022-03-23 DIAGNOSIS — F840 Autistic disorder: Secondary | ICD-10-CM

## 2022-03-23 DIAGNOSIS — F331 Major depressive disorder, recurrent, moderate: Secondary | ICD-10-CM | POA: Diagnosis not present

## 2022-03-23 DIAGNOSIS — F419 Anxiety disorder, unspecified: Secondary | ICD-10-CM | POA: Diagnosis not present

## 2022-03-23 DIAGNOSIS — F319 Bipolar disorder, unspecified: Secondary | ICD-10-CM | POA: Diagnosis not present

## 2022-03-23 DIAGNOSIS — F1911 Other psychoactive substance abuse, in remission: Secondary | ICD-10-CM

## 2022-03-23 DIAGNOSIS — Z79899 Other long term (current) drug therapy: Secondary | ICD-10-CM

## 2022-03-23 MED ORDER — DULOXETINE HCL 60 MG PO CPEP: 60.0000 mg | ORAL_CAPSULE | Freq: Every day | ORAL | 1 refills | Status: AC

## 2022-03-23 MED ORDER — LITHIUM CARBONATE 300 MG PO TABS: 450.0000 mg | ORAL_TABLET | Freq: Every day | ORAL | 1 refills | Status: DC

## 2022-03-23 MED ORDER — OXCARBAZEPINE 300 MG PO TABS
300.0000 mg | ORAL_TABLET | Freq: Two times a day (BID) | ORAL | 1 refills | Status: AC
Start: 2022-03-23 — End: ?

## 2022-03-23 NOTE — Assessment & Plan Note (Signed)
Chronic and well-controlled Previously followed by psychiatry at [facility] Was told he can no longer be seen there due to autism Is looking for a new psychiatrist at this point While waiting to get an appointment, I will manage medications No changes to medications at this time as he is well-controlled Will check monitoring labs 

## 2022-03-23 NOTE — Assessment & Plan Note (Addendum)
Lives alone, but mother is his legal guardian Was told by previous psychiatrist that he could no longer be managed there due to his autism He is looking for a new psychiatrist at this time

## 2022-03-23 NOTE — Assessment & Plan Note (Signed)
Chronic and well-controlled Previously followed by psychiatry at [facility] Was told he can no longer be seen there due to autism Is looking for a new psychiatrist at this point While waiting to get an appointment, I will manage medications No changes to medications at this time as he is well-controlled Will check monitoring labs

## 2022-03-23 NOTE — Assessment & Plan Note (Signed)
History of polysubstance abuse, including alcohol, NyQuil, Tylenol, benzos Reports not using anything other than his prescriptions from psychiatry at this time Has not used alcohol in over 15 months Will avoid controlled substances

## 2022-03-24 LAB — CBC WITH DIFFERENTIAL/PLATELET
Basophils Absolute: 0 10*3/uL (ref 0.0–0.2)
Basos: 0 %
EOS (ABSOLUTE): 0.3 10*3/uL (ref 0.0–0.4)
Eos: 2 %
Hematocrit: 46.8 % (ref 37.5–51.0)
Hemoglobin: 15.1 g/dL (ref 13.0–17.7)
Immature Grans (Abs): 0 10*3/uL (ref 0.0–0.1)
Immature Granulocytes: 0 %
Lymphocytes Absolute: 3.3 10*3/uL — ABNORMAL HIGH (ref 0.7–3.1)
Lymphs: 32 %
MCH: 25.9 pg — ABNORMAL LOW (ref 26.6–33.0)
MCHC: 32.3 g/dL (ref 31.5–35.7)
MCV: 80 fL (ref 79–97)
Monocytes Absolute: 0.5 10*3/uL (ref 0.1–0.9)
Monocytes: 5 %
Neutrophils Absolute: 6.3 10*3/uL (ref 1.4–7.0)
Neutrophils: 61 %
Platelets: 329 10*3/uL (ref 150–450)
RBC: 5.82 x10E6/uL — ABNORMAL HIGH (ref 4.14–5.80)
RDW: 14.1 % (ref 11.6–15.4)
WBC: 10.3 10*3/uL (ref 3.4–10.8)

## 2022-03-24 LAB — COMPREHENSIVE METABOLIC PANEL WITH GFR
ALT: 137 IU/L — ABNORMAL HIGH (ref 0–44)
AST: 88 IU/L — ABNORMAL HIGH (ref 0–40)
Albumin/Globulin Ratio: 1.9 (ref 1.2–2.2)
Albumin: 4.7 g/dL (ref 4.1–5.1)
Alkaline Phosphatase: 131 IU/L — ABNORMAL HIGH (ref 44–121)
BUN/Creatinine Ratio: 9 (ref 9–20)
BUN: 9 mg/dL (ref 6–20)
Bilirubin Total: 0.5 mg/dL (ref 0.0–1.2)
CO2: 23 mmol/L (ref 20–29)
Calcium: 9.9 mg/dL (ref 8.7–10.2)
Chloride: 101 mmol/L (ref 96–106)
Creatinine, Ser: 1 mg/dL (ref 0.76–1.27)
Globulin, Total: 2.5 g/dL (ref 1.5–4.5)
Glucose: 93 mg/dL (ref 70–99)
Potassium: 4.7 mmol/L (ref 3.5–5.2)
Sodium: 140 mmol/L (ref 134–144)
Total Protein: 7.2 g/dL (ref 6.0–8.5)
eGFR: 103 mL/min/1.73 (ref >59)

## 2022-03-24 LAB — LITHIUM LEVEL: Lithium Lvl: 0.2 mmol/L — ABNORMAL LOW (ref 0.5–1.2)

## 2022-04-14 ENCOUNTER — Encounter: Payer: Self-pay | Admitting: Family Medicine

## 2022-04-14 DIAGNOSIS — R7989 Other specified abnormal findings of blood chemistry: Secondary | ICD-10-CM

## 2022-04-15 NOTE — Telephone Encounter (Signed)
We placed a referral in 10/23 for GI. Ok to replace and put Dr Haig Prophet in the comments.

## 2022-05-06 IMAGING — US US ABDOMEN LIMITED
1 series · 14 of 25 positions shown · non-contrast
Comparison: None.

CLINICAL DATA: Elevated liver function test

EXAM:
ULTRASOUND ABDOMEN LIMITED RIGHT UPPER QUADRANT

[Series 1: us abdomen limited ruq (liver/gb) · 14 of 42 slices shown]
[im 1/42]
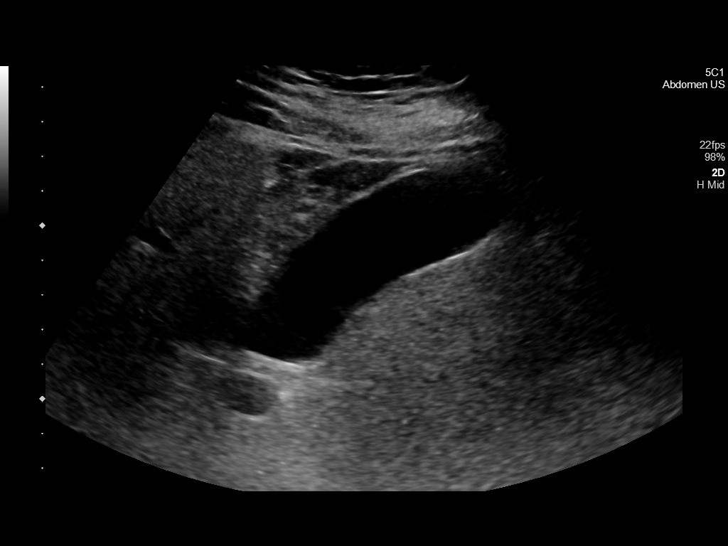
[im 4/42]
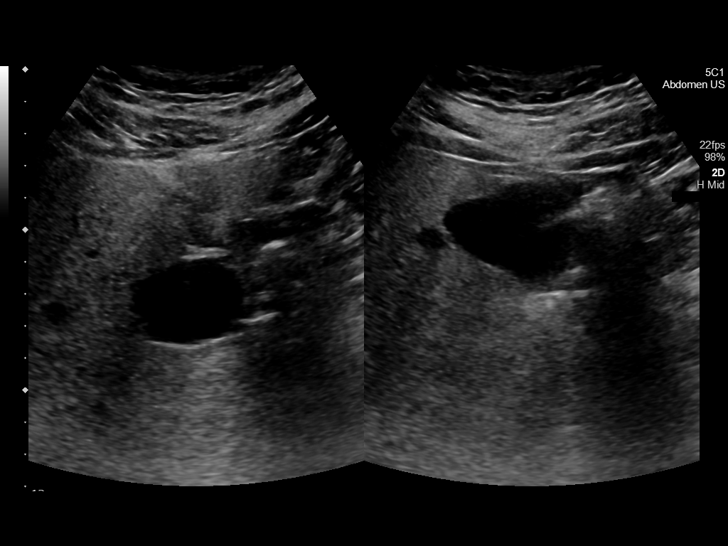
[im 7/42]
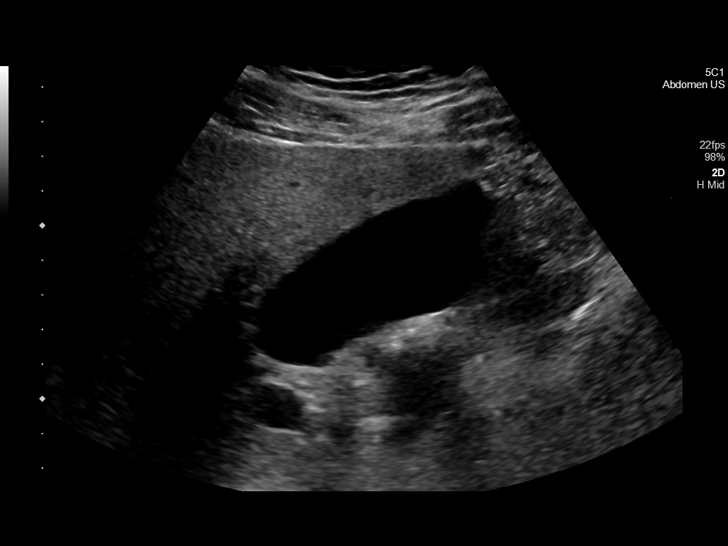
[im 11/42]
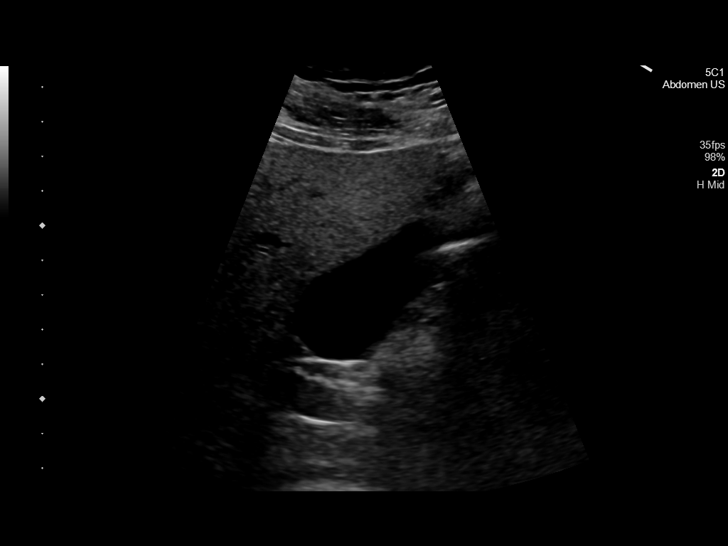
[im 14/42]
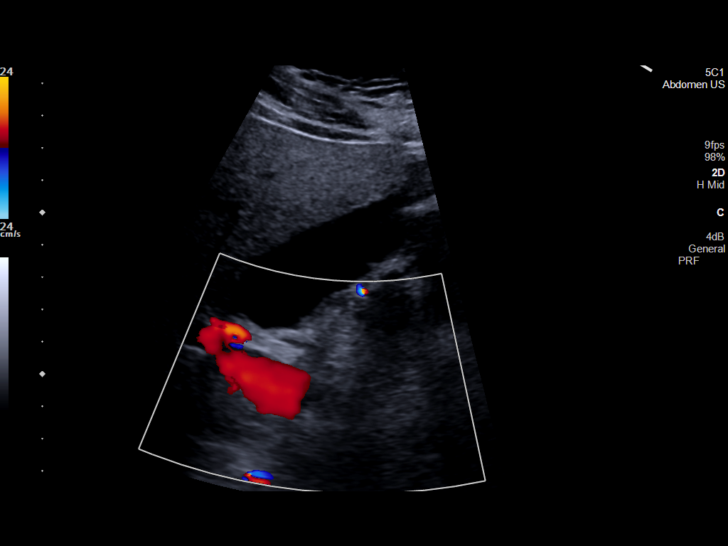
[im 16/42]
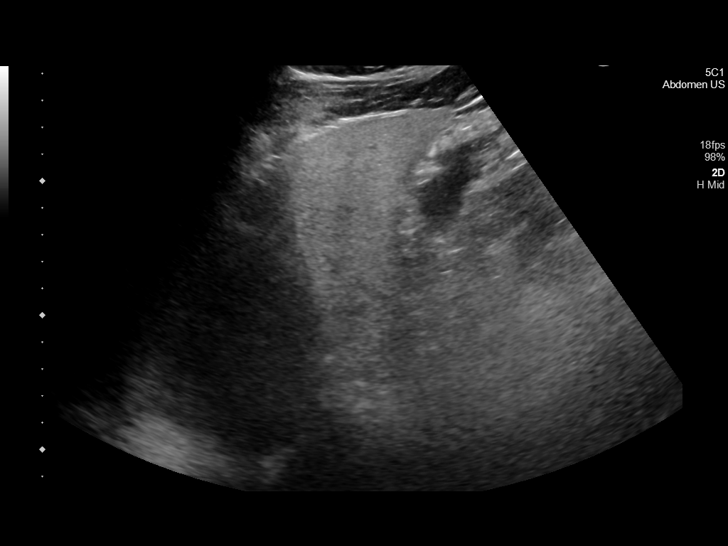
[im 19/42]
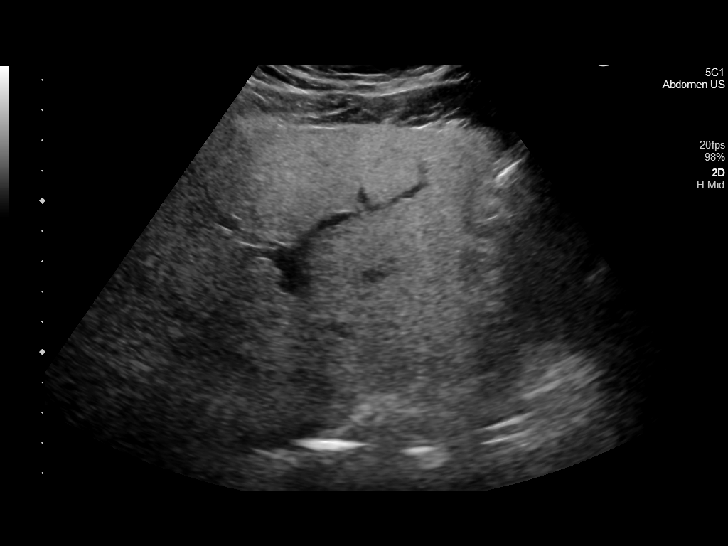
[im 23/42]
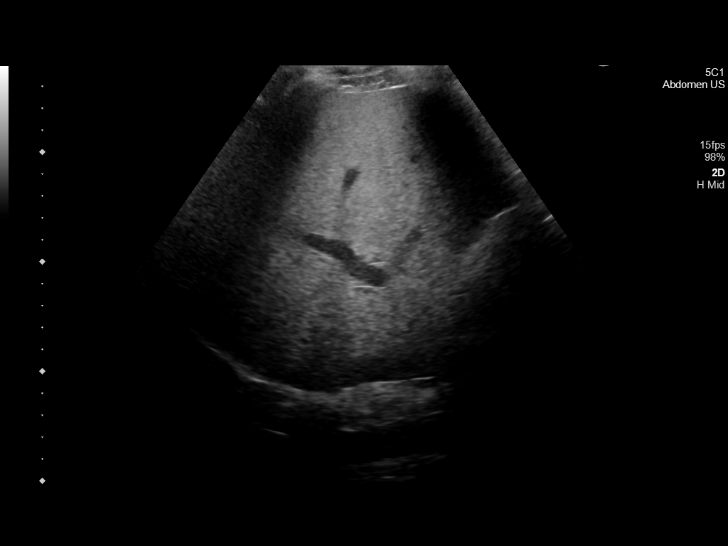
[im 26/42]
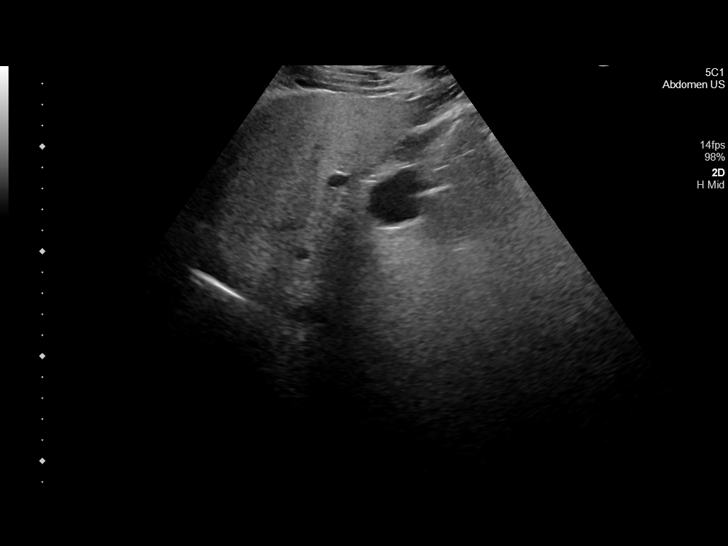
[im 28/42]
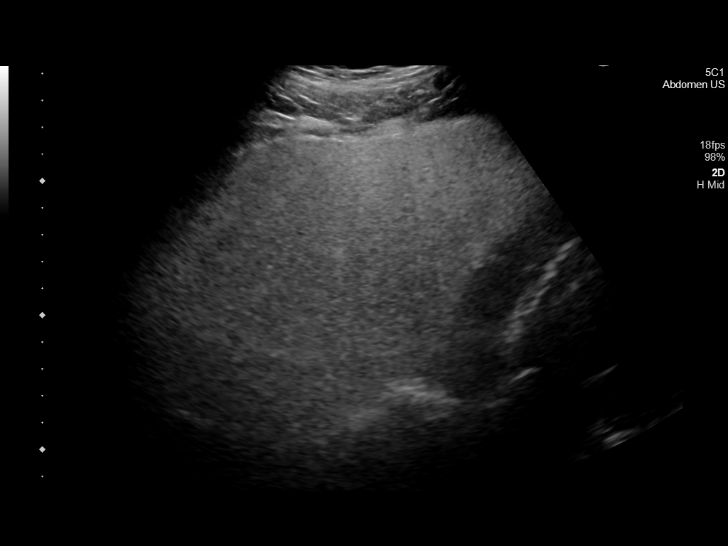
[im 31/42]
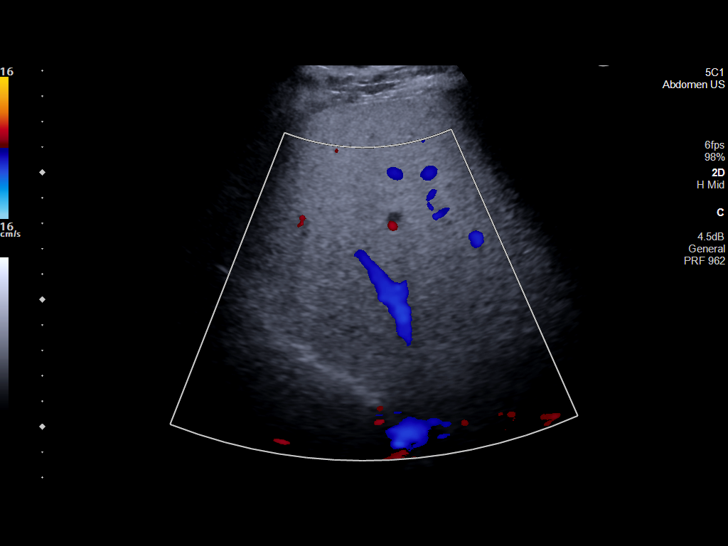
[im 35/42]
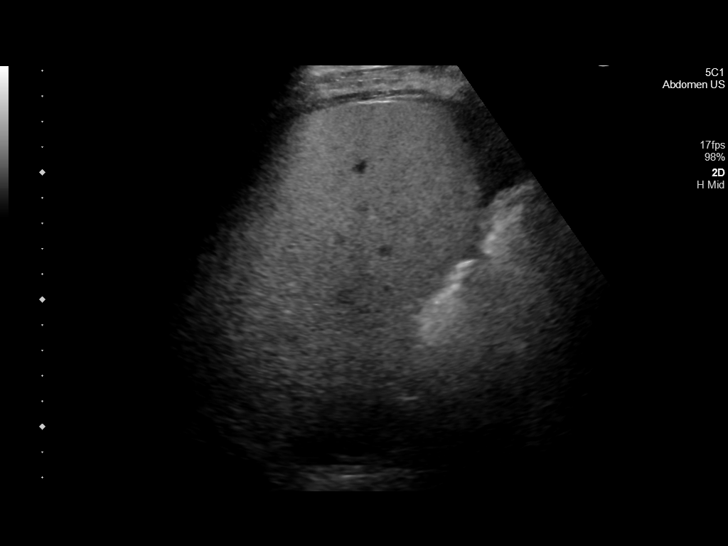
[im 38/42]
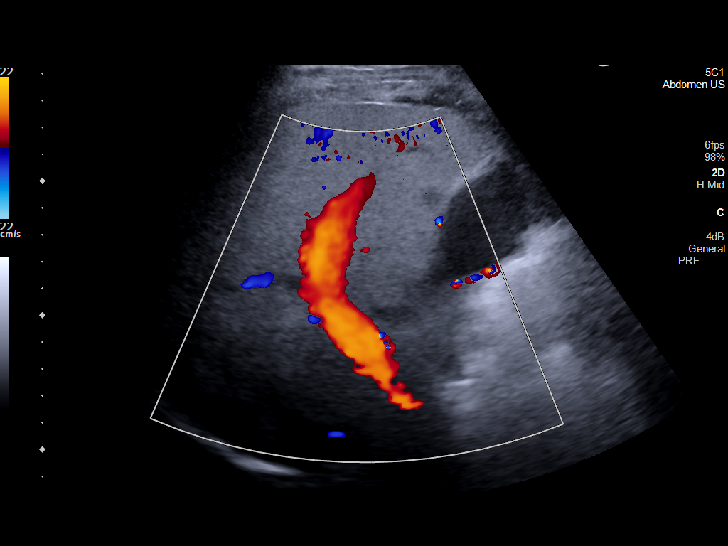
[im 42/42]
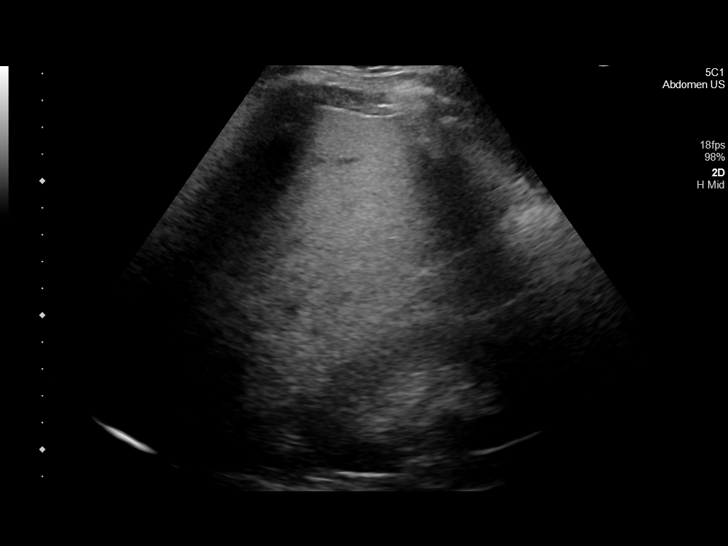

[14 of 25 positions shown; findings below may reference images not displayed]

FINDINGS: Gallbladder:

No gallstones or wall thickening visualized. No sonographic Murphy
sign noted by sonographer.

Common bile duct:

Diameter: 4 mm

Liver:

No focal lesion.

Diffusely increased parenchymal echogenicity.

Portal vein is patent on color Doppler imaging with normal direction
of blood flow towards the liver.

Other: None.
IMPRESSION: Diffuse increased echogenicity of the hepatic parenchyma is a
nonspecific indicator of hepatocellular dysfunction, most commonly
steatosis.

## 2022-12-30 ENCOUNTER — Ambulatory Visit (INDEPENDENT_AMBULATORY_CARE_PROVIDER_SITE_OTHER): Payer: MEDICAID | Admitting: Family Medicine

## 2022-12-30 ENCOUNTER — Encounter: Payer: Self-pay | Admitting: Family Medicine

## 2022-12-30 VITALS — BP 123/80 | HR 90 | Temp 98.0°F | Resp 16 | Ht 69.0 in | Wt 212.6 lb

## 2022-12-30 DIAGNOSIS — Z0000 Encounter for general adult medical examination without abnormal findings: Secondary | ICD-10-CM | POA: Diagnosis not present

## 2022-12-30 DIAGNOSIS — Z79899 Other long term (current) drug therapy: Secondary | ICD-10-CM | POA: Diagnosis not present

## 2022-12-30 DIAGNOSIS — L700 Acne vulgaris: Secondary | ICD-10-CM | POA: Diagnosis not present

## 2022-12-30 DIAGNOSIS — F319 Bipolar disorder, unspecified: Secondary | ICD-10-CM | POA: Diagnosis not present

## 2022-12-30 DIAGNOSIS — L732 Hidradenitis suppurativa: Secondary | ICD-10-CM | POA: Diagnosis not present

## 2022-12-30 MED ORDER — CHLORHEXIDINE GLUCONATE 4 % EX SOLN
Freq: Every day | CUTANEOUS | 2 refills | Status: DC | PRN
Start: 2022-12-30 — End: 2023-08-09

## 2022-12-30 NOTE — Progress Notes (Signed)
Complete physical exam  Patient: Jorge Hanna   DOB: 03-23-1990   31 y.o. Male  MRN: [id number]  Subjective:    Chief Complaint  Patient presents with   Annual Exam    Jorge Hanna is a 32 y.o. male who presents today for a complete physical exam. He reports consuming a general diet.  He generally feels well. He reports sleeping well. He does have additional problems to discuss today.   Discussed the use of AI scribe software for clinical note transcription with the patient, who gave verbal consent to proceed.  History of Present Illness   Jorge Hanna, a patient with a history of mental health issues and elevated liver enzymes, presents today for a routine physical examination. He reports a persistent skin condition characterized by acne and other skin lesions, particularly in the armpit and groin areas. He describes these lesions as often becoming infected, causing discomfort and concern. He also mentions a muscle ache in his arm that is triggered by certain movements or pressure.  In addition to his skin condition, Jorge Hanna discusses his mental health, stating that he has been more stable recently and his psychiatrist has decided to maintain his current medication regimen. However, he mentions struggling with controlling his appetite, leading to weight gain. His psychiatrist is considering adding metformin to his treatment plan to help manage this issue.  Jorge Hanna also brings up his caffeine intake, explaining that he has a history of consuming excessive amounts of caffeine, particularly from energy drinks, which he believes interferes with his mental health. He has since switched to drinking tea, which contains less caffeine, and is seeking advice on a safe daily caffeine limit.  Finally, Jorge Hanna mentions that he has a history of elevated liver enzymes and is scheduled to see a gastroenterologist for further evaluation.        Most recent fall risk assessment:    03/23/2022    8:20 AM  Fall Risk    Falls in the past year? 0  Number falls in past yr: 0  Injury with Fall? 0  Risk for fall due to : No Fall Risks     Most recent depression screenings:    12/30/2022    1:58 PM 03/23/2022    8:20 AM  PHQ 2/9 Scores  PHQ - 2 Score 0 0  PHQ- 9 Score 0 0        Patient Care Team: Erasmo Downer, MD as PCP - General (Family Medicine)   Outpatient Medications Prior to Visit  Medication Sig   DULoxetine (CYMBALTA) 60 MG capsule Take 1 capsule (60 mg total) by mouth daily.   lithium carbonate (ESKALITH) 450 MG ER tablet Take 450 mg by mouth 2 (two) times daily.   Oxcarbazepine (TRILEPTAL) 300 MG tablet Take 1 tablet (300 mg total) by mouth 2 (two) times daily.   [DISCONTINUED] lithium 300 MG tablet Take 1.5 tablets (450 mg total) by mouth daily.   No facility-administered medications prior to visit.    ROS        Objective:     BP 123/80   Pulse 90   Temp 98 F (36.7 C)   Resp 16   Ht 5\' 9"  (1.753 m)   Wt 212 lb 9.6 oz (96.4 kg)   SpO2 98%   BMI 31.40 kg/m    Physical Exam Vitals reviewed.  Constitutional:      General: He is not in acute distress.    Appearance: Normal appearance. He is well-developed. He  is not diaphoretic.  HENT:     Head: Normocephalic and atraumatic.     Right Ear: Tympanic membrane, ear canal and external ear normal.     Left Ear: Tympanic membrane, ear canal and external ear normal.     Nose: Nose normal.     Mouth/Throat:     Mouth: Mucous membranes are moist.     Pharynx: Oropharynx is clear. No oropharyngeal exudate.  Eyes:     General: No scleral icterus.    Conjunctiva/sclera: Conjunctivae normal.     Pupils: Pupils are equal, round, and reactive to light.  Neck:     Thyroid: No thyromegaly.  Cardiovascular:     Rate and Rhythm: Normal rate and regular rhythm.     Pulses: Normal pulses.     Heart sounds: Normal heart sounds. No murmur heard. Pulmonary:     Effort: Pulmonary effort is normal. No respiratory  distress.     Breath sounds: Normal breath sounds. No wheezing or rales.  Abdominal:     General: There is no distension.     Palpations: Abdomen is soft.     Tenderness: There is no abdominal tenderness.  Musculoskeletal:        General: No deformity.     Cervical back: Neck supple.     Right lower leg: No edema.     Left lower leg: No edema.  Lymphadenopathy:     Cervical: No cervical adenopathy.  Skin:    General: Skin is warm and dry.  Neurological:     Mental Status: He is alert and oriented to person, place, and time. Mental status is at baseline.     Gait: Gait normal.  Psychiatric:        Mood and Affect: Mood normal.        Behavior: Behavior normal.        Thought Content: Thought content normal.     Physical Exam   SKIN: Acne and hidradenitis under arms, groin, thighs. Scarring in armpit area. Sebaceous cyst on neck. No evidence of infection observed under arms.       No results found for any visits on 12/30/22.     Assessment & Plan:    Routine Health Maintenance and Physical Exam  Immunization History  Administered Date(s) Administered   Tdap 03/29/2019    Health Maintenance  Topic Date Due   COVID-19 Vaccine (1) Never done   INFLUENZA VACCINE  06/20/2023 (Originally 10/21/2022)   DTaP/Tdap/Td (2 - Td or Tdap) 03/28/2029   Hepatitis C Screening  Completed   HIV Screening  Completed   HPV VACCINES  Aged Out    Discussed health benefits of physical activity, and encouraged him to engage in regular exercise appropriate for his age and condition.  Problem List Items Addressed This Visit       Other   Bipolar 1 disorder (HCC)   Other Visit Diagnoses     Encounter for annual physical exam    -  Primary   Relevant Orders   Comprehensive metabolic panel   CBC w/Diff/Platelet   Hemoglobin A1c   Lipid panel   VITAMIN D 25 Hydroxy (Vit-D Deficiency, Fractures)   Lithium level   EKG 12-Lead   Encounter for long-term (current) use of high-risk  medication       Relevant Orders   Comprehensive metabolic panel   CBC w/Diff/Platelet   Hemoglobin A1c   Lipid panel   VITAMIN D 25 Hydroxy (Vit-D Deficiency, Fractures)   Lithium level  EKG 12-Lead   Hidradenitis suppurativa       Relevant Orders   Ambulatory referral to Dermatology   Acne vulgaris       Relevant Orders   Ambulatory referral to Dermatology           Hidradenitis Suppurativa Recurrent abscesses in the axillary and groin regions, with some scarring. Discussed the differential diagnosis of acne vs hidradenitis. -Start Hibiclens wash daily in the affected areas. -Referral to Dermatology for further evaluation and management. - no active infection requiring abx or I&D  Acne Vulgaris Noted on the chest and back. -Continue current acne treatment. - referral to Derm for further eval and management  Psychiatric Medication Monitoring Stable on current psychiatric medications. Plan to add Metformin for weight control. -Perform labs today as requested by psychiatrist. -Perform EKG today for medication monitoring.  General Health Maintenance -Discussed safe caffeine intake and the patient's efforts to limit consumption. -Discussed past misuse of Benadryl and the potential for delirium with excessive use. Patient reports cessation of misuse. -Schedule next physical exam on the way out.       Return in about 1 year (around 12/30/2023) for CPE.     Shirlee Latch, MD

## 2022-12-31 LAB — COMPREHENSIVE METABOLIC PANEL
ALT: 42 [IU]/L (ref 0–44)
AST: 27 [IU]/L (ref 0–40)
Albumin: 4.8 g/dL (ref 4.1–5.1)
Alkaline Phosphatase: 117 [IU]/L (ref 44–121)
BUN/Creatinine Ratio: 15 (ref 9–20)
BUN: 15 mg/dL (ref 6–20)
Bilirubin Total: 0.3 mg/dL (ref 0.0–1.2)
CO2: 19 mmol/L — ABNORMAL LOW (ref 20–29)
Calcium: 9.8 mg/dL (ref 8.7–10.2)
Chloride: 104 mmol/L (ref 96–106)
Creatinine, Ser: 1.01 mg/dL (ref 0.76–1.27)
Globulin, Total: 2.8 g/dL (ref 1.5–4.5)
Glucose: 94 mg/dL (ref 70–99)
Potassium: 4.5 mmol/L (ref 3.5–5.2)
Sodium: 139 mmol/L (ref 134–144)
Total Protein: 7.6 g/dL (ref 6.0–8.5)
eGFR: 102 mL/min/{1.73_m2} (ref >59)

## 2022-12-31 LAB — LIPID PANEL
Chol/HDL Ratio: 7.8 {ratio} — ABNORMAL HIGH (ref 0.0–5.0)
Cholesterol, Total: 203 mg/dL — ABNORMAL HIGH (ref 100–199)
HDL: 26 mg/dL — ABNORMAL LOW (ref >39)
LDL Chol Calc (NIH): 130 mg/dL — ABNORMAL HIGH (ref 0–99)
Triglycerides: 261 mg/dL — ABNORMAL HIGH (ref 0–149)
VLDL Cholesterol Cal: 47 mg/dL — ABNORMAL HIGH (ref 5–40)

## 2022-12-31 LAB — CBC WITH DIFFERENTIAL/PLATELET
Basophils Absolute: 0.1 10*3/uL (ref 0.0–0.2)
Basos: 1 %
EOS (ABSOLUTE): 0.2 10*3/uL (ref 0.0–0.4)
Eos: 2 %
Hematocrit: 45.6 % (ref 37.5–51.0)
Hemoglobin: 14.7 g/dL (ref 13.0–17.7)
Immature Grans (Abs): 0 10*3/uL (ref 0.0–0.1)
Immature Granulocytes: 0 %
Lymphocytes Absolute: 2.7 10*3/uL (ref 0.7–3.1)
Lymphs: 25 %
MCH: 26.6 pg (ref 26.6–33.0)
MCHC: 32.2 g/dL (ref 31.5–35.7)
MCV: 83 fL (ref 79–97)
Monocytes Absolute: 0.5 10*3/uL (ref 0.1–0.9)
Monocytes: 5 %
Neutrophils Absolute: 7.2 10*3/uL — ABNORMAL HIGH (ref 1.4–7.0)
Neutrophils: 67 %
Platelets: 352 10*3/uL (ref 150–450)
RBC: 5.53 x10E6/uL (ref 4.14–5.80)
RDW: 13.8 % (ref 11.6–15.4)
WBC: 10.6 10*3/uL (ref 3.4–10.8)

## 2022-12-31 LAB — LITHIUM LEVEL: Lithium Lvl: 0.4 mmol/L — ABNORMAL LOW (ref 0.5–1.2)

## 2022-12-31 LAB — HEMOGLOBIN A1C
Est. average glucose Bld gHb Est-mCnc: 105 mg/dL
Hgb A1c MFr Bld: 5.3 % (ref 4.8–5.6)

## 2022-12-31 LAB — VITAMIN D 25 HYDROXY (VIT D DEFICIENCY, FRACTURES): Vit D, 25-Hydroxy: 70.4 ng/mL (ref 30.0–100.0)

## 2023-01-03 DIAGNOSIS — R748 Abnormal levels of other serum enzymes: Secondary | ICD-10-CM | POA: Insufficient documentation

## 2023-02-10 ENCOUNTER — Telehealth: Payer: Self-pay | Admitting: Family Medicine

## 2023-02-10 NOTE — Telephone Encounter (Signed)
Jorge Hanna is calling to report that [facility] in [place] is needing medical clearance for the patient to have a filling completed. Needing a different medicine due to  unable to deaden it. [facility] Phone [phone] Cb- [phone]

## 2023-02-11 NOTE — Telephone Encounter (Signed)
I will not be back in the office until 12/2. Can you please have another provider sign today?

## 2023-02-11 NOTE — Telephone Encounter (Signed)
There was a form printed off on 11/18 or 11/19 that need to be completed.  I will print another one and send it back in your message box.

## 2023-02-11 NOTE — Telephone Encounter (Signed)
Is there a form to complete or are they wanting a call?  Need more information

## 2023-02-11 NOTE — Telephone Encounter (Signed)
The mother called in just to make sure the medical clearance will be signed and sent today. Please assist patient further

## 2023-02-11 NOTE — Telephone Encounter (Signed)
Jorge Hanna stated she gave this to Dr Sherrie Mustache to complete.

## 2023-02-14 ENCOUNTER — Telehealth: Payer: Self-pay | Admitting: Family Medicine

## 2023-02-16 NOTE — Telephone Encounter (Signed)
Jorge Hanna, mom & legal guardian called in to check on status of form that is needed. Mom states patient is needing appointment soon and is unable to get him seen by dentist until this paperwork is complete. Mom is frustrated as she would have liked for pt to be seen by dentist this week.

## 2023-02-21 ENCOUNTER — Encounter: Payer: Self-pay | Admitting: Dermatology

## 2023-02-21 ENCOUNTER — Ambulatory Visit (INDEPENDENT_AMBULATORY_CARE_PROVIDER_SITE_OTHER): Payer: MEDICAID | Admitting: Dermatology

## 2023-02-21 DIAGNOSIS — L720 Epidermal cyst: Secondary | ICD-10-CM

## 2023-02-21 DIAGNOSIS — Z79899 Other long term (current) drug therapy: Secondary | ICD-10-CM | POA: Diagnosis not present

## 2023-02-21 DIAGNOSIS — Z5181 Encounter for therapeutic drug level monitoring: Secondary | ICD-10-CM

## 2023-02-21 DIAGNOSIS — L700 Acne vulgaris: Secondary | ICD-10-CM

## 2023-02-21 DIAGNOSIS — L732 Hidradenitis suppurativa: Secondary | ICD-10-CM | POA: Diagnosis not present

## 2023-02-21 DIAGNOSIS — Z7189 Other specified counseling: Secondary | ICD-10-CM

## 2023-02-21 MED ORDER — DOXYCYCLINE MONOHYDRATE 100 MG PO CAPS: 100.0000 mg | ORAL_CAPSULE | Freq: Two times a day (BID) | ORAL | 2 refills | Status: DC

## 2023-02-21 NOTE — Telephone Encounter (Signed)
I do not have this form. It may need to be resent

## 2023-02-21 NOTE — Progress Notes (Signed)
New Patient Visit   Subjective  Jorge Hanna is a 32 y.o. male who presents for the following: acne for a long time and HS for about 5 years. Patient said he has cysts come up and then they burst. Patient advises he flares underarms and groin but he does not want groin examined and said it's the same as underarms. Patient was told to use Hibiclens by PCP but he said he does not use it because it takes too long. No personal history of IBD. Patient is adopted  Some acne at face and back. Patient accompanied by mother who contributes to history.   The patient has spots, moles and lesions to be evaluated, some may be new or changing and the patient may have concern these could be cancer.   The following portions of the chart were reviewed this encounter and updated as appropriate: medications, allergies, medical history  Review of Systems:  No other skin or systemic complaints except as noted in HPI or Assessment and Plan.  Objective  Well appearing patient in no apparent distress; mood and affect are within normal limits.   A focused examination was performed of the following areas: Face, axilla, back  Relevant exam findings are noted in the Assessment and Plan.    Assessment & Plan   HIDRADENITIS SUPPURATIVA, [place] II Exam: numerous inflammatory nodules and few sinus tracts with atrophic scarring of bilateral axillae. Patient reports lesions in groin and defers exam  Flared  Hidradenitis Suppurativa is a chronic; persistent; non-curable, but treatable condition due to abnormal inflamed sweat glands in the body folds (axilla, inframammary, groin, medial thighs), causing recurrent painful draining cysts and scarring. It can be associated with severe scarring acne and cysts; also abscesses and scarring of scalp. The goal is control and prevention of flares, as it is not curable. Scars are permanent and can be thickened. Treatment may include daily use of topical medication and oral  antibiotics.  Oral isotretinoin may also be helpful.  For some cases, Humira or Cosentyx (biologic injections) may be prescribed to decrease the inflammatory process and prevent flares.  When indicated, inflamed cysts may also be treated surgically.  Treatment Plan: Per patient's mother, no hx IBD.  Start doxycycline monohydrate 100 mg bid with food.  Pending labs, will start Cosentyx.  Doxycycline should be taken with food to prevent nausea. Do not lay down for 30 minutes after taking. Be cautious with sun exposure and use good sun protection while on this medication. Pregnant women should not take this medication.  ACNE VULGARIS Exam: numerous inflammatory papules and pustules, some with crusting, atrophic scars at back (upper > mid > lower). Few inflamed papules on face  Chronic flaring not at patient goal  Treatment Plan: Start doxycycline monohydrate 100 mg bid with food.  Lithium can worsen acne, but should still take it as prescribed  Doxycycline should be taken with food to prevent nausea. Do not lay down for 30 minutes after taking. Be cautious with sun exposure and use good sun protection while on this medication. Pregnant women should not take this medication.    EPIDERMAL INCLUSION CYST Exam: Subcutaneous nodule at left neck  Benign-appearing. Exam most consistent with an epidermal inclusion cyst. Discussed that a cyst is a benign growth that can grow over time and sometimes get irritated or inflamed. Recommend observation if it is not bothersome. Discussed option of surgical excision to remove it if it is growing, symptomatic, or other changes noted. Please call for new  or changing lesions so they can be evaluated.   Acne vulgaris  Related Medications doxycycline (MONODOX) 100 MG capsule Take 1 capsule (100 mg total) by mouth 2 (two) times daily. Take with food  Hidradenitis suppurativa  Related Procedures Comprehensive metabolic panel CBC with  Differential/Platelet Hepatitis B surface antigen Hepatitis B surface antibody,qualitative Hepatitis C antibody HIV Antibody (routine testing w rflx) Hepatitis B core antibody, total QuantiFERON-TB Gold Plus  Related Medications doxycycline (MONODOX) 100 MG capsule Take 1 capsule (100 mg total) by mouth 2 (two) times daily. Take with food  Encounter for long-term (current) use of high-risk medication  Related Procedures Comprehensive metabolic panel CBC with Differential/Platelet Hepatitis B surface antigen Hepatitis B surface antibody,qualitative Hepatitis C antibody HIV Antibody (routine testing w rflx) Hepatitis B core antibody, total QuantiFERON-TB Gold Plus    Return in about 3 months (around 05/22/2023) for acne, Hidradenitis.  Anise Salvo, RMA, am acting as scribe for Elie Goody, MD .   Documentation: I have reviewed the above documentation for accuracy and completeness, and I agree with the above.  Elie Goody, MD

## 2023-02-21 NOTE — Patient Instructions (Addendum)
Treatment Plan: Start doxycycline monohydrate 100 mg twice daily with food.   Doxycycline should be taken with food to prevent nausea. Do not lay down for 30 minutes after taking. Be cautious with sun exposure and use good sun protection while on this medication. Pregnant women should not take this medication.   Due to recent changes in healthcare laws, you may see results of your pathology and/or laboratory studies on MyChart before the doctors have had a chance to review them. We understand that in some cases there may be results that are confusing or concerning to you. Please understand that not all results are received at the same time and often the doctors may need to interpret multiple results in order to provide you with the best plan of care or course of treatment. Therefore, we ask that you please give [place] 2 business days to thoroughly review all your results before contacting the office for clarification. Should we see a critical lab result, you will be contacted sooner.   If You Need Anything After Your Visit  If you have any questions or concerns for your doctor, please call our main line at [phone] and press option 4 to reach your doctor's medical assistant. If no one answers, please leave a voicemail as directed and we will return your call as soon as possible. Messages left after 4 pm will be answered the following business day.   You may also send [place] a message via MyChart. We typically respond to MyChart messages within 1-2 business days.  For prescription refills, please ask your pharmacy to contact our office. Our fax number is [phone].  If you have an urgent issue when the clinic is closed that cannot wait until the next business day, you can page your doctor at the number below.    Please note that while we do our best to be available for urgent issues outside of office hours, we are not available 24/7.   If you have an urgent issue and are unable to reach [place], you may  choose to seek medical care at your doctor's office, retail clinic, urgent care center, or emergency room.  If you have a medical emergency, please immediately call 911 or go to the emergency department.  Pager Numbers  - Dr. Gwen Pounds: [phone]  - Dr. Roseanne Reno: [phone]  - Dr. Katrinka Blazing: [phone]   In the event of inclement weather, please call our main line at [phone] for an update on the status of any delays or closures.  Dermatology Medication Tips: Please keep the boxes that topical medications come in in order to help keep track of the instructions about where and how to use these. Pharmacies typically print the medication instructions only on the boxes and not directly on the medication tubes.   If your medication is too expensive, please contact our office at [phone] option 4 or send [place] a message through MyChart.   We are unable to tell what your co-pay for medications will be in advance as this is different depending on your insurance coverage. However, we may be able to find a substitute medication at lower cost or fill out paperwork to get insurance to cover a needed medication.   If a prior authorization is required to get your medication covered by your insurance company, please allow [place] 1-2 business days to complete this process.  Drug prices often vary depending on where the prescription is filled and some pharmacies may offer cheaper prices.  The website www.goodrx.com contains coupons  for medications through different pharmacies. The prices here do not account for what the cost may be with help from insurance (it may be cheaper with your insurance), but the website can give you the price if you did not use any insurance.  - You can print the associated coupon and take it with your prescription to the pharmacy.  - You may also stop by our office during regular business hours and pick up a GoodRx coupon card.  - If you need your prescription sent  electronically to a different pharmacy, notify our office through [facility] or by phone at [phone] option 4.     Si Usted Necesita Algo Despus de Su Visita  Tambin puede enviarnos un mensaje a travs de Clinical cytogeneticist. Por lo general respondemos a los mensajes de MyChart en el transcurso de 1 a 2 das hbiles.  Para renovar recetas, por favor pida a su farmacia que se ponga en contacto con nuestra oficina. Annie Sable de fax es [place] [phone].  Si tiene un asunto urgente cuando la clnica est cerrada y que no puede esperar hasta el siguiente da hbil, puede llamar/localizar a su doctor(a) al nmero que aparece a continuacin.   Por favor, tenga en cuenta que aunque hacemos todo lo posible para estar disponibles para asuntos urgentes fuera del horario de [place], no estamos disponibles las 24 horas del da, los 7 [address]  [address] de la [place].   Si tiene un problema urgente y no puede comunicarse con nosotros, puede optar por buscar atencin mdica  en el consultorio de su doctor(a), en una clnica privada, en un centro de atencin urgente o en una sala de emergencias.  Si tiene Engineer, drilling, por favor llame inmediatamente al 911 o vaya a la sala de emergencias.  Nmeros de bper  - Dr. Gwen Pounds: [phone]  - Dra. Roseanne Reno: [phone]  - Dr. Katrinka Blazing: [phone]   En caso de inclemencias del tiempo, por favor llame a Lacy Duverney principal al [phone] para una actualizacin sobre el [place] de cualquier retraso o cierre.  Consejos para la medicacin en dermatologa: Por favor, guarde las cajas en las que vienen los medicamentos de uso tpico para ayudarle a seguir las instrucciones sobre dnde y cmo usarlos. Las farmacias generalmente imprimen las instrucciones del medicamento slo en las cajas y no directamente en los tubos del [place].   Si su medicamento es muy caro, por favor, pngase en contacto con Rolm Gala llamando al [phone] y presione la  opcin 4 o envenos un mensaje a travs de Clinical cytogeneticist.   No podemos decirle cul ser su copago por los medicamentos por adelantado ya que esto es diferente dependiendo de la cobertura de su seguro. Sin embargo, es posible que podamos encontrar un medicamento sustituto a Audiological scientist un formulario para que el seguro cubra el medicamento que se considera necesario.   Si se requiere una autorizacin previa para que su compaa de seguros [place] su medicamento, por favor permtanos de 1 a 2 das hbiles para completar [address]  Los precios de los medicamentos varan con frecuencia dependiendo del Environmental consultant de dnde se surte la receta y alguna farmacias pueden ofrecer precios ms baratos.  El sitio web www.goodrx.com tiene cupones para medicamentos de Health and safety inspector. Los precios aqu no tienen en cuenta lo que podra costar con la ayuda del seguro (puede ser ms barato con su seguro), pero el sitio web puede darle el precio si no utiliz Tourist information centre manager.  - Puede imprimir el cupn  correspondiente y llevarlo con su receta a la farmacia.  - Tambin puede pasar por nuestra oficina durante el horario de atencin regular y Education officer, museum una tarjeta de cupones de GoodRx.  - Si necesita que su receta se enve electrnicamente a una farmacia diferente, informe a nuestra oficina a travs de MyChart de [place] o por telfono llamando al [phone] y presione la opcin 4.

## 2023-02-22 ENCOUNTER — Telehealth: Payer: Self-pay | Admitting: Family Medicine

## 2023-02-22 NOTE — Telephone Encounter (Signed)
Given to provider to fill out.

## 2023-02-22 NOTE — Telephone Encounter (Signed)
Filled out. Given to [place] up front to call patient's mother that form is ready.

## 2023-02-22 NOTE — Telephone Encounter (Signed)
Dental Clearance form being sent back for completion from [facility]

## 2023-02-22 NOTE — Telephone Encounter (Signed)
Ready. Given to Lowella Bandy to call patient's mother.

## 2023-02-22 NOTE — Telephone Encounter (Signed)
Patient's mother and guardian Santina Evans, has called to check on the status of the form for approval for anaesthetic for patient to have a filling done at the dentist. Patient's mother is upset she has not got a call back about this or this has not been approved yet so patient can have this filling done. Patient's mother stated the urgency of this. Mardee Postin she may need to contact dentist office to have this form re-sent per last message notated. Santina Evans states she will contact dental office. Please advise and follow back up with Santina Evans at phone # [phone].

## 2023-03-02 ENCOUNTER — Encounter: Payer: Self-pay | Admitting: Dermatology

## 2023-03-10 ENCOUNTER — Telehealth: Payer: Self-pay

## 2023-03-10 LAB — COMPREHENSIVE METABOLIC PANEL
ALT: 75 [IU]/L — ABNORMAL HIGH (ref 0–44)
AST: 42 [IU]/L — ABNORMAL HIGH (ref 0–40)
Albumin: 4.7 g/dL (ref 4.1–5.1)
Alkaline Phosphatase: 127 [IU]/L — ABNORMAL HIGH (ref 44–121)
BUN/Creatinine Ratio: 11 (ref 9–20)
BUN: 11 mg/dL (ref 6–20)
Bilirubin Total: 0.3 mg/dL (ref 0.0–1.2)
CO2: 21 mmol/L (ref 20–29)
Calcium: 9.7 mg/dL (ref 8.7–10.2)
Chloride: 102 mmol/L (ref 96–106)
Creatinine, Ser: 1 mg/dL (ref 0.76–1.27)
Globulin, Total: 2.8 g/dL (ref 1.5–4.5)
Glucose: 92 mg/dL (ref 70–99)
Potassium: 5 mmol/L (ref 3.5–5.2)
Sodium: 140 mmol/L (ref 134–144)
Total Protein: 7.5 g/dL (ref 6.0–8.5)
eGFR: 103 mL/min/{1.73_m2} (ref >59)

## 2023-03-10 LAB — CBC WITH DIFFERENTIAL/PLATELET
Basophils Absolute: 0.1 10*3/uL (ref 0.0–0.2)
Basos: 1 %
EOS (ABSOLUTE): 0.3 10*3/uL (ref 0.0–0.4)
Eos: 3 %
Hematocrit: 46.9 % (ref 37.5–51.0)
Hemoglobin: 15.2 g/dL (ref 13.0–17.7)
Immature Grans (Abs): 0 10*3/uL (ref 0.0–0.1)
Immature Granulocytes: 0 %
Lymphocytes Absolute: 2.4 10*3/uL (ref 0.7–3.1)
Lymphs: 24 %
MCH: 26.5 pg — ABNORMAL LOW (ref 26.6–33.0)
MCHC: 32.4 g/dL (ref 31.5–35.7)
MCV: 82 fL (ref 79–97)
Monocytes Absolute: 0.5 10*3/uL (ref 0.1–0.9)
Monocytes: 5 %
Neutrophils Absolute: 6.8 10*3/uL (ref 1.4–7.0)
Neutrophils: 67 %
Platelets: 391 10*3/uL (ref 150–450)
RBC: 5.74 x10E6/uL (ref 4.14–5.80)
RDW: 13.3 % (ref 11.6–15.4)
WBC: 10.2 10*3/uL (ref 3.4–10.8)

## 2023-03-10 LAB — QUANTIFERON-TB GOLD PLUS
QuantiFERON Mitogen Value: 6.86 [IU]/mL
QuantiFERON Nil Value: 0.03 [IU]/mL
QuantiFERON TB1 Ag Value: 0.07 [IU]/mL
QuantiFERON TB2 Ag Value: 0.06 [IU]/mL
QuantiFERON-TB Gold Plus: NEGATIVE

## 2023-03-10 LAB — HEPATITIS B SURFACE ANTIGEN: Hepatitis B Surface Ag: NEGATIVE

## 2023-03-10 LAB — HEPATITIS C ANTIBODY: Hep C Virus Ab: NONREACTIVE

## 2023-03-10 LAB — HEPATITIS B SURFACE ANTIBODY,QUALITATIVE: Hep B Surface Ab, Qual: UNDETERMINED

## 2023-03-10 LAB — HIV ANTIBODY (ROUTINE TESTING W REFLEX): HIV Screen 4th Generation wRfx: NONREACTIVE

## 2023-03-10 MED ORDER — COSENTYX SENSOREADY (300 MG) 150 MG/ML ~~LOC~~ SOAJ: 300.0000 mg | SUBCUTANEOUS | 5 refills | Status: DC

## 2023-03-10 MED ORDER — COSENTYX SENSOREADY (300 MG) 150 MG/ML ~~LOC~~ SOAJ: 300.0000 mg | SUBCUTANEOUS | 0 refills | Status: DC

## 2023-03-10 NOTE — Telephone Encounter (Signed)
-----   Message from [facility] sent at 03/10/2023 10:10 AM EST ----- Please prescribe cosentyx pens for hidradenitis suppurativa. 300 mg every week for weeks 0, 1, 2, 3, 4 and then 300 every 4 weeks

## 2023-03-10 NOTE — Telephone Encounter (Signed)
Left pt msg to call for lab results.  Need to advise patient Cosentyx has been sent to [facility]

## 2023-03-10 NOTE — Telephone Encounter (Signed)
Advised patient's mother that Cosentyx sent to [facility], the process, and to look out for their phone call. Advised mother that if they are having issues getting prescription or if they need anything else to contact our office.

## 2023-03-29 ENCOUNTER — Telehealth: Payer: Self-pay

## 2023-03-29 ENCOUNTER — Ambulatory Visit: Payer: MEDICAID

## 2023-03-29 DIAGNOSIS — L732 Hidradenitis suppurativa: Secondary | ICD-10-CM

## 2023-03-29 MED ORDER — SECUKINUMAB 150 MG/ML ~~LOC~~ SOSY
300.0000 mg | PREFILLED_SYRINGE | Freq: Once | SUBCUTANEOUS | Status: AC
  Administered 2023-03-29: 300 mg via SUBCUTANEOUS

## 2023-03-29 NOTE — Telephone Encounter (Signed)
 Patient came into office today for first round of loading dose of Cosentyx. Patient was too scared to self inject at home due to uncertainty of pain level.   Okay to enter orders for injection today?

## 2023-03-29 NOTE — Progress Notes (Signed)
 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   Cosentyx  150mg  pen X2 injected into right arm and right thigh. Patient tolerated okay. Patient plans to finish loading dose at home.   LOT: [id number]  EXP: October 19, 2024  Alan Pizza, [place]

## 2023-04-12 ENCOUNTER — Other Ambulatory Visit: Payer: Self-pay | Admitting: Dermatology

## 2023-04-13 MED ORDER — COSENTYX SENSOREADY (300 MG) 150 MG/ML ~~LOC~~ SOAJ: 300.0000 mg | SUBCUTANEOUS | 0 refills | Status: DC

## 2023-05-23 ENCOUNTER — Encounter: Payer: Self-pay | Admitting: Dermatology

## 2023-05-23 ENCOUNTER — Ambulatory Visit: Payer: MEDICAID | Admitting: Dermatology

## 2023-05-23 DIAGNOSIS — D492 Neoplasm of unspecified behavior of bone, soft tissue, and skin: Secondary | ICD-10-CM | POA: Diagnosis not present

## 2023-05-23 DIAGNOSIS — D1801 Hemangioma of skin and subcutaneous tissue: Secondary | ICD-10-CM | POA: Diagnosis not present

## 2023-05-23 DIAGNOSIS — Z7189 Other specified counseling: Secondary | ICD-10-CM

## 2023-05-23 DIAGNOSIS — L732 Hidradenitis suppurativa: Secondary | ICD-10-CM | POA: Diagnosis not present

## 2023-05-23 DIAGNOSIS — L700 Acne vulgaris: Secondary | ICD-10-CM

## 2023-05-23 DIAGNOSIS — Z79899 Other long term (current) drug therapy: Secondary | ICD-10-CM | POA: Diagnosis not present

## 2023-05-23 MED ORDER — COSENTYX UNOREADY 300 MG/2ML ~~LOC~~ SOAJ
300.0000 mg | SUBCUTANEOUS | 7 refills | Status: DC
Start: 2023-05-23 — End: 2023-08-09

## 2023-05-23 NOTE — Patient Instructions (Signed)

## 2023-05-23 NOTE — Progress Notes (Signed)
 Follow-Up Visit   Subjective  Jorge Hanna is a 33 y.o. male who presents for the following: Acne and HS follow up. Pt reports acne well controlled with Doxycycline. Pt denies any side effects. Pt on Cosentyx 300 mg has not noticed much difference but is tolerating self-injecting at home, next injection is on Friday.  The patient has spots, moles and lesions to be evaluated, some may be new or changing and the patient may have concern these could be cancer.   The following portions of the chart were reviewed this encounter and updated as appropriate: medications, allergies, medical history  Review of Systems:  No other skin or systemic complaints except as noted in HPI or Assessment and Plan.  Objective  Well appearing patient in no apparent distress; mood and affect are within normal limits.   A focused examination was performed of the following areas: Face  Relevant exam findings are noted in the Assessment and Plan.  Left Nasal Sidewall 3 mm red vascular papule   Assessment & Plan   HIDRADENITIS SUPPURATIVA, [place] II Exam: prior exam (deferred today) inflammatory nodules and few sinus tracts with atrophic scarring of bilateral axillae.    Chronic and persistent condition with duration or expected duration over one year. Condition is bothersome/symptomatic for patient.     Hidradenitis Suppurativa is a chronic; persistent; non-curable, but treatable condition due to abnormal inflamed sweat glands in the body folds (axilla, inframammary, groin, medial thighs), causing recurrent painful draining cysts and scarring. It can be associated with severe scarring acne and cysts; also abscesses and scarring of scalp. The goal is control and prevention of flares, as it is not curable. Scars are permanent and can be thickened. Treatment may include daily use of topical medication and oral antibiotics.  Oral isotretinoin may also be helpful.  For some cases, Humira or Cosentyx (biologic  injections) may be prescribed to decrease the inflammatory process and prevent flares.  When indicated, inflamed cysts may also be treated surgically.   Treatment Plan: Continue Cosentyx 300 mg injections every 28 days Sent unoready pens so that patient only has to inject one per dose  ACNE VULGARIS Exam: reduced inflammatory papules on face and neck   Chronic and persistent condition with duration or expected duration over one year. Condition is bothersome/symptomatic for patient. Currently improved on doxycycline    Treatment Plan: Continue Doxycycline monohydrate 100 mg bid with food.  Lithium can worsen acne, but should still take it as prescribed   Doxycycline should be taken with food to prevent nausea. Do not lay down for 30 minutes after taking. Be cautious with sun exposure and use good sun protection while on this medication. Pregnant women should not take this medication.   NEOPLASM OF SKIN Left Nasal Sidewall Epidermal / dermal shaving  Lesion diameter (cm):  0.3 Informed consent: discussed and consent obtained   Timeout: patient name, date of birth, surgical site, and procedure verified   Procedure prep:  Patient was prepped and draped in usual sterile fashion Prep type:  Povidone-iodine and isopropyl alcohol Anesthesia: the lesion was anesthetized in a standard fashion   Anesthetic:  1% lidocaine w/ epinephrine 1-100,000 buffered w/ 8.4% NaHCO3 Instrument used: DermaBlade   Hemostasis achieved with: pressure, aluminum chloride and electrodesiccation   Outcome: patient tolerated procedure well   Post-procedure details: wound care instructions given   Specimen 1 - Surgical pathology Differential Diagnosis: cherry angioma vs dermal nevus vs other  Check Margins: No 3 mm red vascular papule  HIDRADENITIS SUPPURATIVA   Related Medications doxycycline (MONODOX) 100 MG capsule Take 1 capsule (100 mg total) by mouth 2 (two) times daily. Take with food Secukinumab  (COSENTYX UNOREADY) 300 MG/2ML SOAJ Inject 300 mg into the skin every 28 (twenty-eight) days. ENCOUNTER FOR LONG-TERM (CURRENT) USE OF HIGH-RISK MEDICATION   Related Medications Secukinumab (COSENTYX UNOREADY) 300 MG/2ML SOAJ Inject 300 mg into the skin every 28 (twenty-eight) days. COUNSELING AND COORDINATION OF CARE   ACNE VULGARIS   Related Medications doxycycline (MONODOX) 100 MG capsule Take 1 capsule (100 mg total) by mouth 2 (two) times daily. Take with food  Return in about 3 months (around 08/23/2023) for w/ Dr. Katrinka Blazing, Acne, HS.  I, [name], CMA, am acting as scribe for Elie Goody, MD .  Documentation: I have reviewed the above documentation for accuracy and completeness, and I agree with the above.  Elie Goody, MD

## 2023-05-26 ENCOUNTER — Encounter: Payer: Self-pay | Admitting: Dermatology

## 2023-05-26 LAB — SURGICAL PATHOLOGY

## 2023-05-29 ENCOUNTER — Other Ambulatory Visit: Payer: Self-pay | Admitting: Dermatology

## 2023-05-29 DIAGNOSIS — L732 Hidradenitis suppurativa: Secondary | ICD-10-CM

## 2023-05-29 DIAGNOSIS — L700 Acne vulgaris: Secondary | ICD-10-CM

## 2023-08-09 ENCOUNTER — Encounter: Payer: Self-pay | Admitting: Dermatology

## 2023-08-09 ENCOUNTER — Ambulatory Visit (INDEPENDENT_AMBULATORY_CARE_PROVIDER_SITE_OTHER): Payer: MEDICAID | Admitting: Dermatology

## 2023-08-09 DIAGNOSIS — Z79899 Other long term (current) drug therapy: Secondary | ICD-10-CM

## 2023-08-09 DIAGNOSIS — L732 Hidradenitis suppurativa: Secondary | ICD-10-CM

## 2023-08-09 DIAGNOSIS — Z792 Long term (current) use of antibiotics: Secondary | ICD-10-CM

## 2023-08-09 DIAGNOSIS — Z7189 Other specified counseling: Secondary | ICD-10-CM

## 2023-08-09 MED ORDER — BIMZELX 320 MG/2ML ~~LOC~~ SOAJ: 320.0000 mg | SUBCUTANEOUS | 0 refills | Status: AC

## 2023-08-09 MED ORDER — BIMZELX 320 MG/2ML ~~LOC~~ SOAJ: 320.0000 mg | SUBCUTANEOUS | 1 refills | Status: DC

## 2023-08-09 NOTE — Progress Notes (Signed)
 Follow Up Visit   Subjective  Jorge Hanna is a 33 y.o. male who presents for the following: bumps - pt states that HS hasn't improved since starting Cosentyx  (first injection 03/29/23), and he continues to flare.He says that injections recently started hurting, and fluid came out of his last injection after he finished injecting. He is still using Doxycycline  100 mg po BID, but sometimes forgets to take it twice daily when he doesn't have food to take with it. Doxycycline  does give him an upset stomach if he doesn't take it with food.  The following portions of the chart were reviewed this encounter and updated as appropriate: medications, allergies, medical history  Review of Systems:  No other skin or systemic complaints except as noted in HPI or Assessment and Plan.  Objective  Well appearing patient in no apparent distress; mood and affect are within normal limits.  A focused examination was performed of the following areas:  The face  Relevant exam findings are noted in the Assessment and Plan.    Assessment & Plan   HIDRADENITIS SUPPURATIVA   Related Medications doxycycline  (MONODOX ) 100 MG capsule TAKE 1 CAPSULE (100 MG TOTAL) BY MOUTH 2 (TWO) TIMES DAILY. TAKE WITH FOOD bimekizumab-bkzx (BIMZELX) 320 MG/2ML pen Inject 2 mLs (320 mg total) into the skin as directed. Inject 320 mg/2mL SQ every 2 weeks for the first 16 weeks, followed by every 4 weeks thereafter. bimekizumab-bkzx (BIMZELX) 320 MG/2ML pen Inject 2 mLs (320 mg total) into the skin every 28 (twenty-eight) days. ENCOUNTER FOR LONG-TERM (CURRENT) USE OF HIGH-RISK MEDICATION   COUNSELING AND COORDINATION OF CARE    HIDRADENITIS SUPPURATIVA, failed cosentyx  Exam: Inflammatory nodules, scarring, B/L axillary area.  03/07/23 quant gold negative  Chronic and persistent condition with duration or expected duration over one year. Condition is symptomatic/ bothersome to patient. Not currently at  goal.  Hidradenitis Suppurativa is a chronic; persistent; non-curable, but treatable condition due to abnormal inflamed sweat glands in the body folds (axilla, inframammary, groin, medial thighs), causing recurrent painful draining cysts and scarring. It can be associated with severe scarring acne and cysts; also abscesses and scarring of scalp. The goal is control and prevention of flares, as it is not curable. Scars are permanent and can be thickened. Treatment may include daily use of topical medication and oral antibiotics.  Oral isotretinoin may also be helpful.  For some cases, Humira or Cosentyx  (biologic injections) may be prescribed to decrease the inflammatory process and prevent flares.  When indicated, inflamed cysts may also be treated surgically.  Treatment Plan: D/C Cosentyx , Start Bimzelx 320 mg, given as 1 single 320-mg injection or 2 separate 160-mg injections under the skin every 2 weeks for the first 16 weeks, followed by every 4 weeks thereafter.   Bimzelx 320mg /37mL auto injector pen gave to patient to take home for week 0 and 2. Educated patient on how to self inject with demonstrator pen. He feels comfortable doing injections at home and had no questions or concerns. Reviewed risks of biologics including immunosuppression, infections, injection site reaction, and failure to improve condition. Goal is control of skin condition, not cure.  Some older biologics such as Humira and Enbrel may slightly increase risk of malignancy and may worsen congestive heart failure.  Taltz and Cosentyx  may cause inflammatory bowel disease to flare. The use of biologics requires long term medication management, including periodic office visits and monitoring of blood work.  Continue Doxycycline  100 mg po BID. Doxycycline  should be taken  with food to prevent nausea. Do not lay down for 30 minutes after taking. Be cautious with sun exposure and use good sun protection while on this medication. Pregnant  women should not take this medication.   Long term medication management.  Patient is using long term (months to years) prescription medication  to control their dermatologic condition.  These medications require periodic monitoring to evaluate for efficacy and side effects and may require periodic laboratory monitoring.   Return for HS follow up in 3 1/2 mths.  Arlinda Lais, CMA, am acting as scribe for Harris Liming, MD .  Documentation: I have reviewed the above documentation for accuracy and completeness, and I agree with the above.  Harris Liming, MD

## 2023-08-09 NOTE — Patient Instructions (Signed)

## 2023-08-23 ENCOUNTER — Ambulatory Visit: Payer: MEDICAID | Admitting: Dermatology

## 2023-09-15 ENCOUNTER — Other Ambulatory Visit: Payer: Self-pay | Admitting: Dermatology

## 2023-09-15 DIAGNOSIS — L732 Hidradenitis suppurativa: Secondary | ICD-10-CM

## 2023-09-15 DIAGNOSIS — L700 Acne vulgaris: Secondary | ICD-10-CM

## 2023-11-10 ENCOUNTER — Ambulatory Visit (INDEPENDENT_AMBULATORY_CARE_PROVIDER_SITE_OTHER): Payer: MEDICAID | Admitting: Dermatology

## 2023-11-10 ENCOUNTER — Encounter: Payer: Self-pay | Admitting: Dermatology

## 2023-11-10 DIAGNOSIS — D1801 Hemangioma of skin and subcutaneous tissue: Secondary | ICD-10-CM | POA: Diagnosis not present

## 2023-11-10 DIAGNOSIS — Z7189 Other specified counseling: Secondary | ICD-10-CM

## 2023-11-10 DIAGNOSIS — L732 Hidradenitis suppurativa: Secondary | ICD-10-CM

## 2023-11-10 DIAGNOSIS — L720 Epidermal cyst: Secondary | ICD-10-CM | POA: Diagnosis not present

## 2023-11-10 DIAGNOSIS — Z79899 Other long term (current) drug therapy: Secondary | ICD-10-CM

## 2023-11-10 NOTE — Progress Notes (Signed)
 Follow Up Visit   Subjective  Jorge Hanna is a 33 y.o. male who presents for the following: bumps - pt hasn't noticed an improvement in condition since starting Bimzelx  for HS. He states that the medication stings badly even when he leaves it out for an hour before injecting. Pt isn't sure he wants to continue Bimzelx  due to pain/stinging sensation, but his mother looked up medication and said it could take 6-12 mths to improve. He continues to use Doxycycline , but only when he eats, because it upsets his stomach, and he doesn't take it daily. Pt c/o cyst on the L neck and he would like to discuss removal, he is concerned about location and being near his carotid.   The following portions of the chart were reviewed this encounter and updated as appropriate: medications, allergies, medical history  Review of Systems:  No other skin or systemic complaints except as noted in HPI or Assessment and Plan.  Objective  Well appearing patient in no apparent distress; mood and affect are within normal limits.  A focused examination was performed of the following areas:  Axillary areas, face, abdomen, and neck.  Relevant exam findings are noted in the Assessment and Plan.    Assessment & Plan   HIDRADENITIS SUPPURATIVA   Related Medications bimekizumab -bkzx (BIMZELX ) 320 MG/2ML pen Inject 2 mLs (320 mg total) into the skin as directed. Inject 320 mg/2mL SQ every 2 weeks for the first 16 weeks, followed by every 4 weeks thereafter. bimekizumab -bkzx (BIMZELX ) 320 MG/2ML pen Inject 2 mLs (320 mg total) into the skin every 28 (twenty-eight) days. doxycycline  (MONODOX ) 100 MG capsule TAKE 1 CAPSULE (100 MG TOTAL) BY MOUTH 2 (TWO) TIMES DAILY. TAKE WITH FOOD LONG-TERM USE OF HIGH-RISK MEDICATION   COUNSELING AND COORDINATION OF CARE   MEDICATION MANAGEMENT   EPIDERMOID CYST   Related Procedures US  Soft Tissue Head/Neck (NON-THYROID ) CHERRY ANGIOMA    HIDRADENITIS  SUPPURATIVA Exam: postinflammatory pink-purple patches in axillae  Chronic and persistent condition with duration or expected duration over one year. Condition is symptomatic/ bothersome to patient. Not currently at goal, but some improvement since starting Bimzelx .  Hidradenitis Suppurativa is a chronic; persistent; non-curable, but treatable condition due to abnormal inflamed sweat glands in the body folds (axilla, inframammary, groin, medial thighs), causing recurrent painful draining cysts and scarring. It can be associated with severe scarring acne and cysts; also abscesses and scarring of scalp. The goal is control and prevention of flares, as it is not curable. Scars are permanent and can be thickened. Treatment may include daily use of topical medication and oral antibiotics.  Oral isotretinoin may also be helpful.  For some cases, Humira or Cosentyx  (biologic injections) may be prescribed to decrease the inflammatory process and prevent flares.  When indicated, inflamed cysts may also be treated surgically.  Treatment Plan: Continue Bimzelx  320mg /mL SQ Q2W until week 16 then Q4W thereafter. Pt currently injecting in the ant thigh, recommend trying to inject into the abdomen instead to see if less painful. Continue Doxycycline  100 mg po BID.   Reviewed risks of biologics including immunosuppression, infections (i.e. TB reactivation), injection site reaction, and failure to improve condition. Goal is control of skin condition, not cure.  Some older biologics such as Humira and Enbrel may slightly increase risk of malignancy and may worsen congestive heart failure.  Taltz, Cosentyx , and Bimzelx  may cause inflammatory bowel disease to flare or may increase incidence of yeast infections. Skyrizi, Tremfya, and Stelara may also slightly increase  risk of infection. The use of biologics requires long term medication management, including periodic office visits, annual TB screening test and monitoring of  blood work.  Doxycycline  should be taken with food to prevent nausea. Do not lay down for 30 minutes after taking. Be cautious with sun exposure and use good sun protection while on this medication. Pregnant women should not take this medication.   Long term medication management.  Patient is using long term (months to years) prescription medication  to control their dermatologic condition.  These medications require periodic monitoring to evaluate for efficacy and side effects and may require periodic laboratory monitoring.   HEMANGIOMA Exam: red papule(s) abdomen Discussed benign nature. Recommend observation. Call for changes.  EPIDERMAL INCLUSION CYST Exam: Subcutaneous nodule at L ant neck  Benign-appearing. Exam most consistent with an epidermal inclusion cyst. Discussed that a cyst is a benign growth that can grow over time and sometimes get irritated or inflamed. Recommend observation if it is not bothersome. Discussed option of surgical excision to remove it if it is growing, symptomatic, or other changes noted. Recommend ultrasound of area due to location, plan referral to Dr. Corey for excision. Will contact pt's mother with CPT and ICD10 codes so they can verify that surgery is covered by insurance. Please call for new or changing lesions so they can be evaluated.  Return in about 3 months (around 02/10/2024) for HS follow up.  LILLETTE Rosina Mayans, CMA, am acting as scribe for Boneta Sharps, MD .  Documentation: I have reviewed the above documentation for accuracy and completeness, and I agree with the above.  Boneta Sharps, MD

## 2023-11-10 NOTE — Patient Instructions (Signed)

## 2023-11-16 ENCOUNTER — Telehealth: Payer: Self-pay | Admitting: Dermatology

## 2023-11-16 NOTE — Telephone Encounter (Signed)
 Called patient's mother to give codes for EIC excision so they can check insurance coverage, but got answering machine. Codes would be 11422 and [id number]

## 2023-11-18 ENCOUNTER — Ambulatory Visit
Admission: RE | Admit: 2023-11-18 | Discharge: 2023-11-18 | Disposition: A | Discharge status: Home / Self Care | Payer: MEDICAID | Source: Ambulatory Visit | Attending: Dermatology | Admitting: Dermatology

## 2023-11-18 DIAGNOSIS — L720 Epidermal cyst: Secondary | ICD-10-CM | POA: Diagnosis present

## 2023-11-28 ENCOUNTER — Ambulatory Visit: Payer: Self-pay | Admitting: Dermatology

## 2023-11-28 NOTE — Telephone Encounter (Signed)
 Spoke with patient's mother and advised her that ultrasound suggestive of a benign cyst. Codes given so she can check with insurance to see if excision is covered. Patient's mother will let us  know if covered and if they would like referral to Dr. Paci for excision. Lonell RAMAN., RMA

## 2023-11-28 NOTE — Telephone Encounter (Signed)
-----   Message from [place] sent at 11/28/2023  2:39 PM EDT ----- IMPRESSION: Superficial complex cyst in the region of clinical concern measuring up to 1.8 cm. Differential considerations include sebaceous and epidermoid cyst.  Plan: please call to share that ultrasound is suggestive of benign cyst. Please give excision codes to patient's mother so she can call insurance to check if codes are covered for epidermoid cyst:  11422 and 87957. If codes are covered then please refer to Dr Corey for excision ----- Message ----- From: Interface, Rad Results In Sent: 11/25/2023   2:42 PM EDT To: Boneta Sharps, MD

## 2023-12-19 ENCOUNTER — Other Ambulatory Visit: Payer: Self-pay | Admitting: Dermatology

## 2023-12-19 DIAGNOSIS — L700 Acne vulgaris: Secondary | ICD-10-CM

## 2023-12-19 DIAGNOSIS — L732 Hidradenitis suppurativa: Secondary | ICD-10-CM

## 2024-01-10 ENCOUNTER — Ambulatory Visit (INDEPENDENT_AMBULATORY_CARE_PROVIDER_SITE_OTHER): Payer: MEDICAID | Admitting: Family Medicine

## 2024-01-10 ENCOUNTER — Encounter: Payer: Self-pay | Admitting: Family Medicine

## 2024-01-10 VITALS — BP 122/65 | HR 76 | Ht 69.0 in | Wt 199.9 lb

## 2024-01-10 DIAGNOSIS — F1021 Alcohol dependence, in remission: Secondary | ICD-10-CM

## 2024-01-10 DIAGNOSIS — Z0000 Encounter for general adult medical examination without abnormal findings: Secondary | ICD-10-CM | POA: Diagnosis not present

## 2024-01-10 DIAGNOSIS — Z789 Other specified health status: Secondary | ICD-10-CM | POA: Diagnosis not present

## 2024-01-10 DIAGNOSIS — Z79899 Other long term (current) drug therapy: Secondary | ICD-10-CM | POA: Diagnosis not present

## 2024-01-10 NOTE — Progress Notes (Signed)
 Complete physical exam   Patient: Jorge Hanna   DOB: March 13, 1991   32 y.o. Male  MRN: [id number] Visit Date: 01/10/2024  Today's healthcare provider: Jon Eva, MD   Chief Complaint  Patient presents with   Annual Exam    Last completed 12/30/22 Diet -  healthy Exercise - walking a lot on cruise Feeling - well outside of cold Sleeping - well Concerns -  none   Subjective    Jorge Hanna is a 33 y.o. male who presents today for a complete physical exam.    Discussed the use of AI scribe software for clinical note transcription with the patient, who gave verbal consent to proceed.  History of Present Illness   Jorge Hanna is a 33 year old male who presents for an annual physical exam.  He experiences a sensation in his throat when drinking very cold water quickly, feeling as if something is moving. He has increased his physical activity by walking more and maintains a healthy diet. He does not smoke, use drugs, or consume alcohol and is not sexually active.  He is under psychiatric care and takes lithium . He recently returned from a cruise with his father, during which he engaged in significant walking. He caught a minor cold but tested negative for COVID-19. He experiences some abdominal discomfort from coughing but denies any other abdominal pain.          01/10/2024    1:56 PM 12/30/2022    1:58 PM  GAD 7 : Generalized Anxiety Score  Nervous, Anxious, on Edge 0 0  Control/stop worrying 0 0  Worry too much - different things 0 0  Trouble relaxing 0 0  Restless 0 0  Easily annoyed or irritable 0 0  Afraid - awful might happen 0 0  Total GAD 7 Score 0 0  Anxiety Difficulty Not difficult at all      Last depression screening scores    01/10/2024    1:56 PM 12/30/2022    1:58 PM 03/23/2022    8:20 AM  PHQ 2/9 Scores  PHQ - 2 Score 0 0 0  PHQ- 9 Score  0 0   Last fall risk screening    03/23/2022    8:20 AM  Fall Risk   Falls in  the past year? 0  Number falls in past yr: 0  Injury with Fall? 0  Risk for fall due to : No Fall Risks        Medications: Outpatient Medications Prior to Visit  Medication Sig   bimekizumab -bkzx (BIMZELX ) 320 MG/2ML pen Inject 2 mLs (320 mg total) into the skin as directed. Inject 320 mg/2mL SQ every 2 weeks for the first 16 weeks, followed by every 4 weeks thereafter.   bimekizumab -bkzx (BIMZELX ) 320 MG/2ML pen Inject 2 mLs (320 mg total) into the skin every 28 (twenty-eight) days.   doxycycline  (MONODOX ) 100 MG capsule TAKE 1 CAPSULE (100 MG TOTAL) BY MOUTH 2 (TWO) TIMES DAILY. TAKE WITH FOOD   DULoxetine  (CYMBALTA ) 60 MG capsule Take 1 capsule (60 mg total) by mouth daily.   lithium  carbonate (ESKALITH ) 450 MG ER tablet Take 450 mg by mouth 2 (two) times daily.   metFORMIN (GLUCOPHAGE-XR) 500 MG 24 hr tablet Take 1,000 mg by mouth 2 (two) times daily.   Oxcarbazepine  (TRILEPTAL ) 300 MG tablet Take 1 tablet (300 mg total) by mouth 2 (two) times daily.   topiramate (TOPAMAX) 100 MG tablet Take by mouth.   [  DISCONTINUED] lithium  carbonate 150 MG capsule Take 150 mg by mouth at bedtime. (Patient not taking: Reported on 01/10/2024)   No facility-administered medications prior to visit.    Review of Systems    Objective    BP 122/65 (BP Location: Left Arm, Patient Position: Sitting, Cuff Size: Normal)   Pulse 76   Ht 5' 9 (1.753 m)   Wt 199 lb 14.4 oz (90.7 kg)   SpO2 100%   BMI 29.52 kg/m    Physical Exam Vitals reviewed.  Constitutional:      General: He is not in acute distress.    Appearance: Normal appearance. He is well-developed. He is not diaphoretic.  HENT:     Head: Normocephalic and atraumatic.     Right Ear: Tympanic membrane, ear canal and external ear normal.     Left Ear: Tympanic membrane, ear canal and external ear normal.     Nose: Nose normal.     Mouth/Throat:     Mouth: Mucous membranes are moist.     Pharynx: Oropharynx is clear. No  oropharyngeal exudate.  Eyes:     General: No scleral icterus.    Conjunctiva/sclera: Conjunctivae normal.     Pupils: Pupils are equal, round, and reactive to light.  Neck:     Thyroid : No thyromegaly.  Cardiovascular:     Rate and Rhythm: Normal rate and regular rhythm.     Heart sounds: Normal heart sounds. No murmur heard. Pulmonary:     Effort: Pulmonary effort is normal. No respiratory distress.     Breath sounds: Normal breath sounds. No wheezing or rales.  Abdominal:     General: There is no distension.     Palpations: Abdomen is soft.     Tenderness: There is no abdominal tenderness.  Musculoskeletal:        General: No deformity.     Cervical back: Neck supple.     Right lower leg: No edema.     Left lower leg: No edema.  Lymphadenopathy:     Cervical: No cervical adenopathy.  Skin:    General: Skin is warm and dry.     Findings: No rash.  Neurological:     Mental Status: He is alert and oriented to person, place, and time. Mental status is at baseline.     Gait: Gait normal.  Psychiatric:        Mood and Affect: Mood normal.        Behavior: Behavior normal.        Thought Content: Thought content normal.      No results found for any visits on 01/10/24.  Assessment & Plan    Routine Health Maintenance and Physical Exam  Exercise Activities and Dietary recommendations  Goals   None     Immunization History  Administered Date(s) Administered   Tdap 03/29/2019    Health Maintenance  Topic Date Due   COVID-19 Vaccine (1) Never done   Hepatitis B Vaccines 19-59 Average Risk (1 of 3 - 19+ 3-dose series) Never done   HPV VACCINES (1 - 3-dose SCDM series) Never done   Influenza Vaccine  06/19/2024 (Originally 10/21/2023)   DTaP/Tdap/Td (2 - Td or Tdap) 03/28/2029   Hepatitis C Screening  Completed   HIV Screening  Completed   Pneumococcal Vaccine  Aged Out   Meningococcal B Vaccine  Aged Out    Discussed health benefits of physical activity, and  encouraged him to engage in regular exercise appropriate for his age and  condition.  Problem List Items Addressed This Visit       Other   Alcohol use disorder, moderate, in early remission, in controlled environment, dependence (HCC)   Other Visit Diagnoses       Encounter for annual physical exam    -  Primary   Relevant Orders   Hepatitis B Surface AntiBODY   Lipid panel   Comprehensive metabolic panel with GFR   CBC w/Diff/Platelet   Hemoglobin A1c   Lithium  level     Hepatitis B vaccination status unknown       Relevant Orders   Hepatitis B Surface AntiBODY     Long-term use of high-risk medication       Relevant Orders   Lipid panel   Comprehensive metabolic panel with GFR   CBC w/Diff/Platelet   Hemoglobin A1c   Lithium  level          Adult Wellness Visit Routine adult wellness visit with no acute concerns. Engages in regular walking and maintains a healthy diet. No smoking, drug use, or alcohol consumption. Not sexually active. Declined flu shot. Discussed HPV and hepatitis B vaccinations; HPV optional until age 48, hepatitis B status to be confirmed with labs. - Ordered kidney and liver function panel, blood counts, A1c, cholesterol, and lithium  level - Checked hepatitis B vaccination status with labs - Provided lab slip for blood draw  Bipolar disorder, on lithium  therapy Bipolar disorder managed with lithium  therapy. Continues psychiatric care for medication management. - Continue lithium  therapy - Sent lab results to psychiatrist for review  Nicotine and substance use, in remission Nicotine and substance use in remission. No current smoking, drug use, or alcohol consumption. Maintains abstinence. - Continue current abstinence from nicotine and substances       Return in about 1 year (around 01/09/2025) for CPE.     Jon Eva, MD  [facility] [phone] (phone) [phone] (fax)  [facility]

## 2024-01-11 LAB — LITHIUM LEVEL: Lithium Lvl: 0.6 mmol/L (ref 0.5–1.2)

## 2024-01-11 LAB — COMPREHENSIVE METABOLIC PANEL WITH GFR
ALT: 125 IU/L — ABNORMAL HIGH (ref 0–44)
AST: 65 IU/L — ABNORMAL HIGH (ref 0–40)
Albumin: 4.7 g/dL (ref 4.1–5.1)
Alkaline Phosphatase: 91 IU/L (ref 47–123)
BUN/Creatinine Ratio: 12 (ref 9–20)
BUN: 11 mg/dL (ref 6–20)
Bilirubin Total: 0.4 mg/dL (ref 0.0–1.2)
CO2: 21 mmol/L (ref 20–29)
Calcium: 10.2 mg/dL (ref 8.7–10.2)
Chloride: 104 mmol/L (ref 96–106)
Creatinine, Ser: 0.91 mg/dL (ref 0.76–1.27)
Globulin, Total: 2.6 g/dL (ref 1.5–4.5)
Glucose: 74 mg/dL (ref 70–99)
Potassium: 4.1 mmol/L (ref 3.5–5.2)
Sodium: 141 mmol/L (ref 134–144)
Total Protein: 7.3 g/dL (ref 6.0–8.5)
eGFR: 115 mL/min/1.73 (ref >59)

## 2024-01-11 LAB — CBC WITH DIFFERENTIAL/PLATELET
Basophils Absolute: 0 x10E3/uL (ref 0.0–0.2)
Basos: 0 %
EOS (ABSOLUTE): 0.3 x10E3/uL (ref 0.0–0.4)
Eos: 4 %
Hematocrit: 47.1 % (ref 37.5–51.0)
Hemoglobin: 15.1 g/dL (ref 13.0–17.7)
Immature Grans (Abs): 0 x10E3/uL (ref 0.0–0.1)
Immature Granulocytes: 0 %
Lymphocytes Absolute: 2.2 x10E3/uL (ref 0.7–3.1)
Lymphs: 27 %
MCH: 27.7 pg (ref 26.6–33.0)
MCHC: 32.1 g/dL (ref 31.5–35.7)
MCV: 86 fL (ref 79–97)
Monocytes Absolute: 0.4 x10E3/uL (ref 0.1–0.9)
Monocytes: 5 %
Neutrophils Absolute: 5.2 x10E3/uL (ref 1.4–7.0)
Neutrophils: 64 %
Platelets: 409 x10E3/uL (ref 150–450)
RBC: 5.45 x10E6/uL (ref 4.14–5.80)
RDW: 14 % (ref 11.6–15.4)
WBC: 8.1 x10E3/uL (ref 3.4–10.8)

## 2024-01-11 LAB — HEMOGLOBIN A1C
Est. average glucose Bld gHb Est-mCnc: 100 mg/dL
Hgb A1c MFr Bld: 5.1 % (ref 4.8–5.6)

## 2024-01-11 LAB — LIPID PANEL
Chol/HDL Ratio: 6.5 ratio — ABNORMAL HIGH (ref 0.0–5.0)
Cholesterol, Total: 162 mg/dL (ref 100–199)
HDL: 25 mg/dL — ABNORMAL LOW (ref >39)
LDL Chol Calc (NIH): 110 mg/dL — ABNORMAL HIGH (ref 0–99)
Triglycerides: 152 mg/dL — ABNORMAL HIGH (ref 0–149)
VLDL Cholesterol Cal: 27 mg/dL (ref 5–40)

## 2024-01-11 LAB — HEPATITIS B SURFACE ANTIBODY,QUALITATIVE: Hep B Surface Ab, Qual: NONREACTIVE

## 2024-01-13 ENCOUNTER — Ambulatory Visit: Payer: Self-pay | Admitting: Family Medicine

## 2024-01-13 DIAGNOSIS — R7989 Other specified abnormal findings of blood chemistry: Secondary | ICD-10-CM

## 2024-02-01 ENCOUNTER — Other Ambulatory Visit: Payer: Self-pay

## 2024-02-01 DIAGNOSIS — L732 Hidradenitis suppurativa: Secondary | ICD-10-CM

## 2024-02-01 MED ORDER — BIMZELX 320 MG/2ML ~~LOC~~ SOAJ: 320.0000 mg | SUBCUTANEOUS | 0 refills | Status: DC

## 2024-02-01 NOTE — Progress Notes (Signed)
 Refill request per [facility] fax. aw

## 2024-02-09 ENCOUNTER — Ambulatory Visit: Payer: MEDICAID | Admitting: Dermatology

## 2024-02-21 LAB — NASH FIBROSURE(R) PLUS

## 2024-02-22 LAB — NASH FIBROSURE(R) PLUS
ALPHA 2-MACROGLOBULINS, QN: 81 mg/dL — AB (ref 110–276)
ALT (SGPT) P5P: 97 IU/L — AB (ref 0–55)
AST (SGOT) P5P: 54 IU/L — AB (ref 0–40)
Apolipoprotein A-1: 117 mg/dL (ref 101–178)
Bilirubin, Total: 0.2 mg/dL (ref 0.0–1.2)
Cholesterol, Total: 177 mg/dL (ref 100–199)
Fibrosis Score: 0.02 (ref 0.00–0.21)
GGT: 45 IU/L (ref 0–65)
Glucose: 88 mg/dL (ref 70–99)
Haptoglobin: 217 mg/dL (ref 17–317)
NASH Score: 0.52 — AB (ref 0.00–0.25)
Steatosis Score: 0.79 — AB (ref 0.00–0.40)
Triglycerides: 338 mg/dL — AB (ref 0–149)

## 2024-02-22 LAB — HEPATIC FUNCTION PANEL
ALT: 85 IU/L — ABNORMAL HIGH (ref 0–44)
AST: 46 IU/L — ABNORMAL HIGH (ref 0–40)
Albumin: 4.7 g/dL (ref 4.1–5.1)
Alkaline Phosphatase: 104 IU/L (ref 47–123)
Bilirubin Total: 0.2 mg/dL (ref 0.0–1.2)
Bilirubin, Direct: 0.09 mg/dL (ref 0.00–0.40)
Total Protein: 7.5 g/dL (ref 6.0–8.5)

## 2024-02-27 ENCOUNTER — Encounter: Payer: Self-pay | Admitting: Dermatology

## 2024-02-27 ENCOUNTER — Ambulatory Visit: Payer: MEDICAID | Admitting: Dermatology

## 2024-02-27 DIAGNOSIS — R21 Rash and other nonspecific skin eruption: Secondary | ICD-10-CM

## 2024-02-27 DIAGNOSIS — L304 Erythema intertrigo: Secondary | ICD-10-CM

## 2024-02-27 DIAGNOSIS — L732 Hidradenitis suppurativa: Secondary | ICD-10-CM | POA: Diagnosis not present

## 2024-02-27 DIAGNOSIS — Z79899 Other long term (current) drug therapy: Secondary | ICD-10-CM | POA: Diagnosis not present

## 2024-02-27 DIAGNOSIS — Z7189 Other specified counseling: Secondary | ICD-10-CM

## 2024-02-27 MED ORDER — KETOCONAZOLE 2 % EX CREA: TOPICAL_CREAM | CUTANEOUS | 5 refills | Status: AC

## 2024-02-27 MED ORDER — HYDROCORTISONE 2.5 % EX CREA: TOPICAL_CREAM | CUTANEOUS | 3 refills | Status: AC

## 2024-02-27 MED ORDER — BIMZELX 320 MG/2ML ~~LOC~~ SOAJ: 320.0000 mg | SUBCUTANEOUS | 5 refills | Status: AC

## 2024-02-27 NOTE — Patient Instructions (Addendum)
 Continue Bimzelx  320 mg/ml every 4 weeks as directed  Reviewed risks of biologics including immunosuppression, infections (i.e. TB reactivation), injection site reaction, and failure to improve condition. Goal is control of skin condition, not cure.  Some older biologics such as Humira and Enbrel may slightly increase risk of malignancy and may worsen congestive heart failure.  Taltz, Cosentyx , and Bimzelx  may cause inflammatory bowel disease to flare or may increase incidence of yeast infections. Skyrizi, Tremfya, and Stelara may also slightly increase risk of infection. The use of biologics requires long term medication management, including periodic office visits, annual TB screening test and monitoring of blood work.      Start ketoconazole  2% cream twice a day to groin as needed for rash.  Start hydrocortisone  2.5% cream twice a day to groin area until rash is clear then as needed for flares.   Topical steroids (such as triamcinolone, fluocinolone, fluocinonide, mometasone, clobetasol, halobetasol, betamethasone, hydrocortisone ) can cause thinning and lightening of the skin if they are used for too long in the same area. Your physician has selected the right strength medicine for your problem and area affected on the body. Please use your medication only as directed by your physician to prevent side effects.   Start Zeasorb AF powder or other OTC antifungal powder to the area daily to prevent rash recurrence. Other options to help keep the area dry include blow drying the area after bathing or using antiperspirant products such as Duradry to help keep the area dry.    Due to recent changes in healthcare laws, you may see results of your pathology and/or laboratory studies on MyChart before the doctors have had a chance to review them. We understand that in some cases there may be results that are confusing or concerning to you. Please understand that not all results are received at the same time  and often the doctors may need to interpret multiple results in order to provide you with the best plan of care or course of treatment. Therefore, we ask that you please give us  2 business days to thoroughly review all your results before contacting the office for clarification. Should we see a critical lab result, you will be contacted sooner.   If You Need Anything After Your Visit  If you have any questions or concerns for your doctor, please call our main line at [phone] and press option 4 to reach your doctor's medical assistant. If no one answers, please leave a voicemail as directed and we will return your call as soon as possible. Messages left after 4 pm will be answered the following business day.   You may also send us  a message via MyChart. We typically respond to MyChart messages within 1-2 business days.  For prescription refills, please ask your pharmacy to contact our office. Our fax number is [phone].  If you have an urgent issue when the clinic is closed that cannot wait until the next business day, you can page your doctor at the number below.    Please note that while we do our best to be available for urgent issues outside of office hours, we are not available 24/7.   If you have an urgent issue and are unable to reach us , you may choose to seek medical care at your doctor's office, retail clinic, urgent care center, or emergency room.  If you have a medical emergency, please immediately call 911 or go to the emergency department.  Pager Numbers  - Dr. Hester: [phone]  -  Dr. Jackquline: [phone]  - Dr. Claudene: [phone]   - Dr. Raymund: [phone]  In the event of inclement weather, please call our main line at [phone] for an update on the status of any delays or closures.  Dermatology Medication Tips: Please keep the boxes that topical medications come in in order to help keep track of the instructions about where and how to use these.  Pharmacies typically print the medication instructions only on the boxes and not directly on the medication tubes.   If your medication is too expensive, please contact our office at [phone] option 4 or send us  a message through MyChart.   We are unable to tell what your co-pay for medications will be in advance as this is different depending on your insurance coverage. However, we may be able to find a substitute medication at lower cost or fill out paperwork to get insurance to cover a needed medication.   If a prior authorization is required to get your medication covered by your insurance company, please allow us  1-2 business days to complete this process.  Drug prices often vary depending on where the prescription is filled and some pharmacies may offer cheaper prices.  The website www.goodrx.com contains coupons for medications through different pharmacies. The prices here do not account for what the cost may be with help from insurance (it may be cheaper with your insurance), but the website can give you the price if you did not use any insurance.  - You can print the associated coupon and take it with your prescription to the pharmacy.  - You may also stop by our office during regular business hours and pick up a GoodRx coupon card.  - If you need your prescription sent electronically to a different pharmacy, notify our office through [facility] or by phone at [phone] option 4.     Si Usted Necesita Algo Despus de Su Visita  Tambin puede enviarnos un mensaje a travs de Clinical Cytogeneticist. Por lo general respondemos a los mensajes de MyChart en el transcurso de 1 a 2 das hbiles.  Para renovar recetas, por favor pida a su farmacia que se ponga en contacto con nuestra oficina. [place] de fax es [place] [phone].  Si tiene un asunto urgente cuando la clnica est cerrada y que no puede esperar hasta el siguiente da hbil, puede llamar/localizar a su doctor(a) al nmero  que aparece a continuacin.   Por favor, tenga en cuenta que aunque hacemos todo lo posible para estar disponibles para asuntos urgentes fuera del horario de [place], no estamos disponibles las 24 horas del da, los 7 [address]  [address] de la [place].   Si tiene un problema urgente y no puede comunicarse con nosotros, puede optar por buscar atencin mdica  en el consultorio de su doctor(a), en una clnica privada, en un centro de atencin urgente o en una sala de emergencias.  Si tiene engineer, drilling, por favor llame inmediatamente al 911 o vaya a la sala de emergencias.  Nmeros de bper  - Dr. Hester: [phone]  - Dra. Jackquline: [phone]  - Dr. Claudene: [phone]  - Dra. Kitts: [phone]  En caso de inclemencias del [place], por favor llame a nuestra lnea principal al [phone] para una actualizacin sobre el estado de cualquier retraso o cierre.  Consejos para la medicacin en dermatologa: Por favor, guarde las cajas en las que vienen los medicamentos de uso tpico para ayudarle a seguir las instrucciones sobre dnde y cmo usarlos. Las toll brothers  generalmente imprimen las instrucciones del medicamento slo en las cajas y no directamente en los tubos del [place].   Si su medicamento es muy caro, por favor, pngase en contacto con landry rieger llamando al [phone] y presione la opcin 4 o envenos un mensaje a travs de Clinical Cytogeneticist.   No podemos decirle cul ser su copago por los medicamentos por adelantado ya que esto es diferente dependiendo de la cobertura de su seguro. Sin embargo, es posible que podamos encontrar un medicamento sustituto a audiological scientist un formulario para que el seguro cubra el medicamento que se considera necesario.   Si se requiere una autorizacin previa para que su compaa de seguros cubra su medicamento, por favor permtanos de 1 a 2 das hbiles para completar este proceso.  Los precios de los medicamentos varan con frecuencia  dependiendo del environmental consultant de dnde se surte la receta y alguna farmacias pueden ofrecer precios ms baratos.  El sitio web www.goodrx.com tiene cupones para medicamentos de health and safety inspector. Los precios aqu no tienen en cuenta lo que podra costar con la ayuda del seguro (puede ser ms barato con su seguro), pero el sitio web puede darle el precio si no utiliz tourist information centre manager.  - Puede imprimir el cupn correspondiente y llevarlo con su receta a la farmacia.  - Tambin puede pasar por nuestra oficina durante el horario de atencin regular y education officer, museum una tarjeta de cupones de GoodRx.  - Si necesita que su receta se enve electrnicamente a una farmacia diferente, informe a nuestra oficina a travs de MyChart de [place] o por telfono llamando al [phone] y presione la opcin 4.

## 2024-02-27 NOTE — Progress Notes (Signed)
 Follow Up Visit   Subjective  Jorge Hanna is a 33 y.o. male who presents for the following: HS. 3-4 month follow up. Injecting Bimzelx  300 mg every 4 weeks as directed. Started 08/09/2023.Tolerating well.  He states HS has improved but LFT levels have increased. Concerned it could be side effect of Bimzelx . Taking Doxycycline  100 mg twice a day as directed.  HS did not improve with Cosentyx .  Patient states he has a Hx of chronic alcohol abuse, Nyquil abuse and prescription medication abuse.   C/O jock-itch in groin area. Using topical cream from [employer], not helping. Dur: couple of weeks.    The following portions of the chart were reviewed this encounter and updated as appropriate: medications, allergies, medical history  Review of Systems:  No other skin or systemic complaints except as noted in HPI or Assessment and Plan.  Objective  Well appearing patient in no apparent distress; mood and affect are within normal limits.  A focused examination was performed of the following areas:  Face, axillae, groin  Relevant exam findings are noted in the Assessment and Plan.    Assessment & Plan   HIDRADENITIS SUPPURATIVA   Related Procedures QuantiFERON-TB Gold Plus Related Medications bimekizumab -bkzx (BIMZELX ) 320 MG/2ML pen Inject 2 mLs (320 mg total) into the skin as directed. Inject 320 mg/2mL SQ every 2 weeks for the first 16 weeks, followed by every 4 weeks thereafter. doxycycline  (MONODOX ) 100 MG capsule TAKE 1 CAPSULE (100 MG TOTAL) BY MOUTH 2 (TWO) TIMES DAILY. TAKE WITH FOOD bimekizumab -bkzx (BIMZELX ) 320 MG/2ML pen Inject 2 mLs (320 mg total) into the skin every 28 (twenty-eight) days. ENCOUNTER FOR LONG-TERM (CURRENT) USE OF HIGH-RISK MEDICATION   Related Procedures QuantiFERON-TB Gold Plus COUNSELING AND COORDINATION OF CARE   MEDICATION MANAGEMENT   INTERTRIGO   Related Medications ketoconazole  (NIZORAL ) 2 % cream Apply to affected area of skin twice  daily for rash hydrocortisone  2.5 % cream Apply twice daily to affected area of rash until resolved then as needed only for flares.  HIDRADENITIS SUPPURATIVA Exam: deferred  Chronic and persistent condition with duration or expected duration over one year. Condition is improving with treatment but not currently at goal.   Hidradenitis Suppurativa is a chronic; persistent; non-curable, but treatable condition due to abnormal inflamed sweat glands in the body folds (axilla, inframammary, groin, medial thighs), causing recurrent painful draining cysts and scarring. It can be associated with severe scarring acne and cysts; also abscesses and scarring of scalp. The goal is control and prevention of flares, as it is not curable. Scars are permanent and can be thickened. Treatment may include daily use of topical medication and oral antibiotics.  Oral isotretinoin may also be helpful.  For some cases, Humira or Cosentyx  (biologic injections) may be prescribed to decrease the inflammatory process and prevent flares.  When indicated, inflamed cysts may also be treated surgically.  Reviewed labs from 01/10/24 and 02/20/2024.    Treatment Plan: Can stop Bimzelx , repeat labs in 2-3 months to see if LFT has improved. Advised PCP noticed fatty liver changes on US  from 2023, LFT elevation not likely related to Bimzelx . Patient opts to continue bimzelx   Continue Bimzelx  320 mg/ml every 4 weeks as directed  Repeat quant gold  Reviewed risks of biologics including immunosuppression, infections (i.e. TB reactivation), injection site reaction, and failure to improve condition. Goal is control of skin condition, not cure.  Some older biologics such as Humira and Enbrel may slightly increase risk of malignancy and may  worsen congestive heart failure.  Taltz, Cosentyx , and Bimzelx  may cause inflammatory bowel disease to flare or may increase incidence of yeast infections. Skyrizi, Tremfya, and Stelara may also  slightly increase risk of infection. The use of biologics requires long term medication management, including periodic office visits, annual TB screening test and monitoring of blood work.   Long term medication management.  Patient is using long term (months to years) prescription medication  to control their dermatologic condition.  These medications require periodic monitoring to evaluate for efficacy and side effects and may require periodic laboratory monitoring.    Suspicion for INTERTRIGO vs tinea cruris per history Exam Patient defers exam  Chronic and persistent condition with duration or expected duration over one year. Condition is symptomatic/ bothersome to patient. Not currently at goal.   Intertrigo is a chronic recurrent rash that occurs in skin fold areas that may be associated with friction; heat; moisture; yeast; fungus; and bacteria.  It is exacerbated by increased movement / activity; sweating; and higher atmospheric temperature.  Treatment Plan Start ketoconazole  2% cream twice a day to groin as needed for rash.  Start hydrocortisone  2.5% cream twice a day to groin area until rash is clear then as needed for flares.  Contact if not improving  Topical steroids (such as triamcinolone, fluocinolone, fluocinonide, mometasone, clobetasol, halobetasol, betamethasone, hydrocortisone ) can cause thinning and lightening of the skin if they are used for too long in the same area. Your physician has selected the right strength medicine for your problem and area affected on the body. Please use your medication only as directed by your physician to prevent side effects.   Start Zeasorb AF powder or other OTC antifungal powder to the area daily to prevent rash recurrence. Other options to help keep the area dry include blow drying the area after bathing or using antiperspirant products such as Duradry to help keep the area dry.   Return in about 1 year (around 02/26/2025) for HS  Recheck.  I, Jill Parcell, CMA, am acting as scribe for Boneta Sharps, MD.   Documentation: I have reviewed the above documentation for accuracy and completeness, and I agree with the above.  Boneta Sharps, MD

## 2024-03-18 ENCOUNTER — Other Ambulatory Visit: Payer: Self-pay | Admitting: Dermatology

## 2024-03-18 DIAGNOSIS — L732 Hidradenitis suppurativa: Secondary | ICD-10-CM

## 2024-03-18 DIAGNOSIS — L700 Acne vulgaris: Secondary | ICD-10-CM

## 2025-01-10 ENCOUNTER — Encounter: Payer: MEDICAID | Admitting: Family Medicine

## 2025-02-26 ENCOUNTER — Ambulatory Visit: Payer: MEDICAID | Admitting: Dermatology
